# Patient Record
Sex: Male | Born: 1965 | Race: White | Hispanic: No | Marital: Married | Smoking: Never smoker
Health system: Southern US, Community
[De-identification: ages and names within clinical notes are randomized; demographics above are authoritative.]

## PROBLEM LIST (undated history)

## (undated) DIAGNOSIS — B159 Hepatitis A without hepatic coma: Secondary | ICD-10-CM

## (undated) DIAGNOSIS — J151 Pneumonia due to Pseudomonas: Secondary | ICD-10-CM

## (undated) DIAGNOSIS — I482 Chronic atrial fibrillation, unspecified: Secondary | ICD-10-CM

## (undated) DIAGNOSIS — J90 Pleural effusion, not elsewhere classified: Secondary | ICD-10-CM

## (undated) DIAGNOSIS — G7100 Muscular dystrophy, unspecified: Secondary | ICD-10-CM

## (undated) DIAGNOSIS — J9621 Acute and chronic respiratory failure with hypoxia: Secondary | ICD-10-CM

## (undated) HISTORY — PX: COLONOSCOPY W/ POLYPECTOMY: SHX1380

---

## 2008-05-04 HISTORY — PX: PANCREATIC CYST DRAINAGE: SHX2156

## 2008-05-04 HISTORY — PX: CHOLECYSTECTOMY: SHX55

## 2009-12-02 HISTORY — PX: THYROIDECTOMY, PARTIAL: SHX18

## 2016-05-04 HISTORY — PX: CATARACT EXTRACTION, BILATERAL: SHX1313

## 2018-06-24 ENCOUNTER — Inpatient Hospital Stay
Admission: RE | Admit: 2018-06-24 | Discharge: 2018-08-15 | Disposition: A | Discharge status: Rehabilitation Facility | Payer: BLUE CROSS/BLUE SHIELD | Source: Ambulatory Visit | Attending: Internal Medicine | Admitting: Internal Medicine

## 2018-06-24 ENCOUNTER — Other Ambulatory Visit (HOSPITAL_COMMUNITY): Payer: Self-pay

## 2018-06-24 DIAGNOSIS — J9621 Acute and chronic respiratory failure with hypoxia: Secondary | ICD-10-CM | POA: Diagnosis not present

## 2018-06-24 DIAGNOSIS — Z789 Other specified health status: Secondary | ICD-10-CM

## 2018-06-24 DIAGNOSIS — I482 Chronic atrial fibrillation, unspecified: Secondary | ICD-10-CM | POA: Diagnosis not present

## 2018-06-24 DIAGNOSIS — J90 Pleural effusion, not elsewhere classified: Secondary | ICD-10-CM | POA: Diagnosis not present

## 2018-06-24 DIAGNOSIS — G7100 Muscular dystrophy, unspecified: Secondary | ICD-10-CM | POA: Diagnosis not present

## 2018-06-24 DIAGNOSIS — Z9889 Other specified postprocedural states: Secondary | ICD-10-CM

## 2018-06-24 DIAGNOSIS — J151 Pneumonia due to Pseudomonas: Secondary | ICD-10-CM | POA: Diagnosis present

## 2018-06-24 DIAGNOSIS — J9690 Respiratory failure, unspecified, unspecified whether with hypoxia or hypercapnia: Secondary | ICD-10-CM

## 2018-06-24 HISTORY — DX: Pleural effusion, not elsewhere classified: J90

## 2018-06-24 HISTORY — DX: Acute and chronic respiratory failure with hypoxia: J96.21

## 2018-06-24 HISTORY — DX: Chronic atrial fibrillation, unspecified: I48.20

## 2018-06-24 HISTORY — DX: Pneumonia due to Pseudomonas: J15.1

## 2018-06-24 HISTORY — DX: Muscular dystrophy, unspecified: G71.00

## 2018-06-24 LAB — BLOOD GAS, ARTERIAL
Acid-Base Excess: 6.4 mmol/L — ABNORMAL HIGH (ref 0.0–2.0)
Bicarbonate: 30.7 mmol/L — ABNORMAL HIGH (ref 20.0–28.0)
FIO2: 55
O2 Saturation: 96.6 %
PEEP: 5 cmH2O
Patient temperature: 98.6
Pressure control: 12 cmH2O
RATE: 12 resp/min
pCO2 arterial: 47.4 mmHg (ref 32.0–48.0)
pH, Arterial: 7.427 (ref 7.350–7.450)
pO2, Arterial: 87.5 mmHg (ref 83.0–108.0)

## 2018-06-24 MED ORDER — DIPHENHYDRAMINE HCL 50 MG/ML IJ SOLN
25.00 | INTRAMUSCULAR | Status: DC
Start: ? — End: 2018-06-24

## 2018-06-24 MED ORDER — INSULIN GLARGINE 100 UNIT/ML ~~LOC~~ SOLN
1.00 | SUBCUTANEOUS | Status: DC
Start: 2018-06-24 — End: 2018-06-24

## 2018-06-24 MED ORDER — ALBUTEROL SULFATE (2.5 MG/3ML) 0.083% IN NEBU
2.50 | INHALATION_SOLUTION | RESPIRATORY_TRACT | Status: DC
Start: ? — End: 2018-06-24

## 2018-06-24 MED ORDER — FIRST-LANSOPRAZOLE 3 MG/ML PO SUSP
30.00 | ORAL | Status: DC
Start: 2018-06-25 — End: 2018-06-24

## 2018-06-24 MED ORDER — IOPAMIDOL (ISOVUE-300) INJECTION 61%
INTRAVENOUS | Status: AC
  Administered 2018-06-24: 16:00:00
  Filled 2018-06-24: qty 50

## 2018-06-24 MED ORDER — ACETAMINOPHEN 160 MG/5ML PO SUSP
650.00 | ORAL | Status: DC
Start: ? — End: 2018-06-24

## 2018-06-24 MED ORDER — GUAIFENESIN 100 MG/5ML PO LIQD
200.00 | ORAL | Status: DC
Start: 2018-06-24 — End: 2018-06-24

## 2018-06-24 MED ORDER — ACETAMINOPHEN 325 MG PO TABS
650.00 | ORAL_TABLET | ORAL | Status: DC
Start: ? — End: 2018-06-24

## 2018-06-24 MED ORDER — DOCUSATE SODIUM 150 MG/15ML PO LIQD
50.00 | ORAL | Status: DC
Start: 2018-06-25 — End: 2018-06-24

## 2018-06-24 MED ORDER — GENERIC EXTERNAL MEDICATION
5.00 | Status: DC
Start: ? — End: 2018-06-24

## 2018-06-24 MED ORDER — AMIODARONE HCL 200 MG PO TABS
200.00 | ORAL_TABLET | ORAL | Status: DC
Start: 2018-06-25 — End: 2018-06-24

## 2018-06-24 MED ORDER — MELATONIN 1 MG PO TABS
1.00 | ORAL_TABLET | ORAL | Status: DC
Start: 2018-06-24 — End: 2018-06-24

## 2018-06-24 MED ORDER — SODIUM CHLORIDE 0.9 % IV SOLN
10.00 | INTRAVENOUS | Status: DC
Start: ? — End: 2018-06-24

## 2018-06-24 MED ORDER — BARIUM SULFATE 40 % PO SUSP
250.00 | ORAL | Status: DC
Start: ? — End: 2018-06-24

## 2018-06-24 MED ORDER — ACETAMINOPHEN 650 MG RE SUPP
650.00 | RECTAL | Status: DC
Start: ? — End: 2018-06-24

## 2018-06-24 MED ORDER — METOPROLOL TARTRATE 5 MG/5ML IV SOLN
5.00 | INTRAVENOUS | Status: DC
Start: ? — End: 2018-06-24

## 2018-06-24 NOTE — Consult Note (Signed)
Pulmonary Critical Care Medicine [facility] [place]  PULMONARY SERVICE  Date of Service: 06/24/2018  PULMONARY CRITICAL CARE CONSULT   Luis Choi  LFY:[id number]  DOB: 11-17-1965   DOA: 06/24/2018  Referring Physician: Carron Curie, MD  HPI: Luis Choi is a 53 y.o. male seen for follow up of Acute on Chronic Respiratory Failure.  Patient has a history of muscular dystrophy chronic atrial fibrillation hypothyroidism presented to the hospital with increasing palpitations and shortness of breath.  At that time he was found to be in atrial fibrillation with rapid ventricular response treated with IV Cardizem and then subsequently started on a Cardizem drip.  On his chest x-ray on admission he was noted to be in pulmonary edema.  Also had an elevated troponin which was felt to be stress-induced patient had initially been in the hospitalist service however 3 days later ended up being intubated and placed on the ventilator transferred to the ICU.  Apparently patient had some vomiting preceding the intubation which resulted in aspiration pneumonia.  Subsequently patient self extubated however his oxygen requirements went up from 9 to 12 L and ended up having worsening of breathing and so therefore was reintubated.  Spontaneous breathing trials were started on January 13 and patient was subsequently extubated however he only remained off the ventilator for 3-1/2 days.  Cardiac issues continued along with bradycardia as well as tachycardia.  Tracheostomy was eventually performed.  Is sputum grew ESBL Klebsiella and also Pseudomonas so was started on antibiotics.  Pseudomonas was multidrug resistance was treated with meropenem.  Patient was also noted to have a pleural effusion which was evaluated drained and subsequent x-ray showed stabilization.  Review of Systems:  ROS performed and is unremarkable other than noted above.  Past Medical History:  Diagnosis Date  ?Marland Kitchen Atrial fibrillation  (*)  ?. Muscular dystrophy, myotonic (*)  1982  ?Marland Kitchen Viral hepatitis A without mention of hepatic coma  History of Hepatitis, A Virus   Past Surgical History:  Procedure Laterality Date  ?Marland Kitchen Colonoscopy w/ polypectomy N/A 01/11/2015  Procedure: COLONOSCOPY W/ POLYPECTOMY; Surgeon: Chyrl Civatte, MD; Location: [facility] ENDO; Service: Gastroenterology; Laterality: N/A;  ?. Other surgical history  History of Biopsy Muscle - muscular dystophy  ?. Other surgical history  History of Cholecystectomy  ?Marland Kitchen Other surgical history  History of Marsupialization Of Pancreatic Cyst  ?. Other surgical history  History of Thyroid Surgery Hemi-Thyroidectomy Right Lobe  ?Marland Kitchen Other surgical history 12/2009  cyst removed from bile duct  ?Marland Kitchen Thyroidectomy, partial 05/2009   Allergies  Allergen Reactions  ?Marland Kitchen Flonase [Fluticasone]  Nose bleed    Social History   Socioeconomic History  ?. Marital status: Married  Spouse name: Not on file  ?. Number of children: Not on file  ?. Years of education: Not on file  ?Marland Kitchen Highest education level: Not on file  Occupational History  ?Marland Kitchen Not on file  Social Needs  ?Marland Kitchen Financial resource strain: Not on file  ?Marland Kitchen Food insecurity  Worry: Not on file  Inability: Not on file  ?Marland Kitchen Transportation needs  Medical: Not on file  Non-medical: Not on file  Tobacco Use  ?Marland Kitchen Smoking status: Former Smoker  Types: Cigarettes  ?. Smokeless tobacco: Never Used  Substance and Sexual Activity  ?Marland Kitchen Alcohol use: No    Medications: Reviewed on Rounds  Physical Exam:  Vitals: Temperature 98 pulse 70 respiratory 19 blood pressure 95/70 saturations were 99%  Ventilator Settings mode ventilation pressure support FiO2  was 50% tidal volume 500 pressure support 12 PEEP 5  . General: Comfortable at this time . Eyes: Grossly normal lids, irises & conjunctiva . ENT: grossly tongue is normal . Neck: no obvious mass . Cardiovascular: S1-S2 normal no gallop rub is noted  irregular . Respiratory: Coarse rhonchi expansion is equal . Abdomen: Soft and nontender . Skin: no rash seen on limited exam . Musculoskeletal: not rigid . Psychiatric:unable to assess . Neurologic: no seizure no involuntary movements         Labs on Admission:  Basic Metabolic Panel: No results for input(s): NA, K, CL, CO2, GLUCOSE, BUN, CREATININE, CALCIUM, MG, PHOS in the last 168 hours.  No results for input(s): PHART, PCO2ART, PO2ART, HCO3, O2SAT in the last 168 hours.  Liver Function Tests: No results for input(s): AST, ALT, ALKPHOS, BILITOT, PROT, ALBUMIN in the last 168 hours. No results for input(s): LIPASE, AMYLASE in the last 168 hours. No results for input(s): AMMONIA in the last 168 hours.  CBC: No results for input(s): WBC, NEUTROABS, HGB, HCT, MCV, PLT in the last 168 hours.  Cardiac Enzymes: No results for input(s): CKTOTAL, CKMB, CKMBINDEX, TROPONINI in the last 168 hours.  BNP (last 3 results) No results for input(s): BNP in the last 8760 hours.  ProBNP (last 3 results) No results for input(s): PROBNP in the last 8760 hours.   Radiological Exams on Admission: No results found.  Assessment/Plan Active Problems:   Acute on chronic respiratory failure with hypoxia (HCC)   Chronic atrial fibrillation   Pneumonia of both lungs due to Pseudomonas species (HCC)   Pleural effusion, left   Muscular dystrophy (HCC)   1. Acute on chronic respiratory failure with hypoxia we will continue with weaning on protocol which was started.  Patient has failed extubation trials x2 it appears at the other facility so therefore it may be difficult to get him completely off the ventilator at best we may be looking at nocturnal ventilatory support this secondary to his underlying muscular dystrophy. 2. Chronic atrial fibrillation now rate controlled patient did have RVR at the other facility on medications now. 3. Multidrug-resistant Pseudomonas pneumonia treated with  antibiotics we will follow-up on radiological studies.  Continue with supportive care 4. Left-sided pleural effusion reportedly was stable we will follow-up radiologically continue with supportive care 5. Muscular dystrophy advanced disease has failed extubation numerous times we will continue to assess may need nocturnal ventilatory support as indicated  I have personally seen and evaluated the patient, evaluated laboratory and imaging results, formulated the assessment and plan and placed orders. The Patient requires high complexity decision making for assessment and support.  Case was discussed on Rounds with the Respiratory Therapy Staff Time Spent  Yevonne Pax, MD [facility] Pulmonary Critical Care Medicine Sleep Medicine

## 2018-06-25 LAB — CBC
HCT: 33.8 % — ABNORMAL LOW (ref 39.0–52.0)
Hemoglobin: 10.1 g/dL — ABNORMAL LOW (ref 13.0–17.0)
MCH: 29.3 pg (ref 26.0–34.0)
MCHC: 29.9 g/dL — AB (ref 30.0–36.0)
MCV: 98 fL (ref 80.0–100.0)
Platelets: 432 10*3/uL — ABNORMAL HIGH (ref 150–400)
RBC: 3.45 MIL/uL — ABNORMAL LOW (ref 4.22–5.81)
RDW: 14.1 % (ref 11.5–15.5)
WBC: 8.2 10*3/uL (ref 4.0–10.5)
nRBC: 0 % (ref 0.0–0.2)

## 2018-06-25 LAB — COMPREHENSIVE METABOLIC PANEL
ALT: 51 U/L — ABNORMAL HIGH (ref 0–44)
AST: 46 U/L — AB (ref 15–41)
Albumin: 2.5 g/dL — ABNORMAL LOW (ref 3.5–5.0)
Alkaline Phosphatase: 311 U/L — ABNORMAL HIGH (ref 38–126)
Anion gap: 9 (ref 5–15)
BUN: 16 mg/dL (ref 6–20)
CALCIUM: 8.9 mg/dL (ref 8.9–10.3)
CO2: 31 mmol/L (ref 22–32)
Chloride: 100 mmol/L (ref 98–111)
Creatinine, Ser: 0.58 mg/dL — ABNORMAL LOW (ref 0.61–1.24)
GFR calc Af Amer: >60 mL/min (ref >60)
GFR calc non Af Amer: >60 mL/min (ref >60)
Glucose, Bld: 104 mg/dL — ABNORMAL HIGH (ref 70–99)
Potassium: 5 mmol/L (ref 3.5–5.1)
Sodium: 140 mmol/L (ref 135–145)
Total Bilirubin: 0.7 mg/dL (ref 0.3–1.2)
Total Protein: 6.8 g/dL (ref 6.5–8.1)

## 2018-06-27 ENCOUNTER — Encounter: Payer: Self-pay | Admitting: Internal Medicine

## 2018-06-27 ENCOUNTER — Other Ambulatory Visit (HOSPITAL_COMMUNITY): Payer: Self-pay

## 2018-06-27 DIAGNOSIS — G7100 Muscular dystrophy, unspecified: Secondary | ICD-10-CM | POA: Diagnosis present

## 2018-06-27 DIAGNOSIS — J90 Pleural effusion, not elsewhere classified: Secondary | ICD-10-CM | POA: Diagnosis not present

## 2018-06-27 DIAGNOSIS — J151 Pneumonia due to Pseudomonas: Secondary | ICD-10-CM | POA: Diagnosis present

## 2018-06-27 DIAGNOSIS — I482 Chronic atrial fibrillation, unspecified: Secondary | ICD-10-CM | POA: Diagnosis present

## 2018-06-27 DIAGNOSIS — J9621 Acute and chronic respiratory failure with hypoxia: Secondary | ICD-10-CM | POA: Diagnosis not present

## 2018-06-27 LAB — CULTURE, RESPIRATORY W GRAM STAIN

## 2018-06-27 LAB — CULTURE, RESPIRATORY

## 2018-06-27 MED ORDER — IOHEXOL 300 MG/ML  SOLN
75.0000 mL | Freq: Once | INTRAMUSCULAR | Status: AC | PRN
  Administered 2018-06-27: 75 mL via INTRAVENOUS

## 2018-06-27 NOTE — Progress Notes (Signed)
Pulmonary Critical Care Medicine [facility] [place]   PULMONARY CRITICAL CARE SERVICE  PROGRESS NOTE  Date of Service: 06/27/2018  Luis Choi  MRN:[id number]  DOB: 04/28/1966   DOA: 06/24/2018  Referring Physician: Ali Hijazi, MD  HPI: Luis Choi is a 52 y.o. male seen for follow up of Acute on Chronic Respiratory Failure.  Patient is on pressure support mode at this time requiring about 50% FiO2 has been weaning on pressure support not advanced yet otherwise  Medications: Reviewed on Rounds  Physical Exam:  Vitals: Temperature 97.0 pulse 65 respiratory 14 blood pressure 99/64 saturations 99%  Ventilator Settings mode ventilation pressure support FiO2 50% tidal volume 511 PEEP 5 pressure support 12  . General: Comfortable at this time . Eyes: Grossly normal lids, irises & conjunctiva . ENT: grossly tongue is normal . Neck: no obvious mass . Cardiovascular: S1 S2 normal no gallop . Respiratory: Coarse rhonchi expansion is equal . Abdomen: soft . Skin: no rash seen on limited exam . Musculoskeletal: not rigid . Psychiatric:unable to assess . Neurologic: no seizure no involuntary movements         Lab Data:   Basic Metabolic Panel: Recent Labs  Lab 06/25/18 0516  NA 140  K 5.0  CL 100  CO2 31  GLUCOSE 104*  BUN 16  CREATININE 0.58*  CALCIUM 8.9    ABG: Recent Labs  Lab 06/24/18 1400  PHART 7.427  PCO2ART 47.4  PO2ART 87.5  HCO3 30.7*  O2SAT 96.6    Liver Function Tests: Recent Labs  Lab 06/25/18 0516  AST 46*  ALT 51*  ALKPHOS 311*  BILITOT 0.7  PROT 6.8  ALBUMIN 2.5*   No results for input(s): LIPASE, AMYLASE in the last 168 hours. No results for input(s): AMMONIA in the last 168 hours.  CBC: Recent Labs  Lab 06/25/18 0516  WBC 8.2  HGB 10.1*  HCT 33.8*  MCV 98.0  PLT 432*    Cardiac Enzymes: No results for input(s): CKTOTAL, CKMB, CKMBINDEX, TROPONINI in the last 168 hours.  BNP (last 3 results) No  results for input(s): BNP in the last 8760 hours.  ProBNP (last 3 results) No results for input(s): PROBNP in the last 8760 hours.  Radiological Exams: Ct Chest W Contrast  Result Date: 06/27/2018 CLINICAL DATA:  Chest abscess. EXAM: CT CHEST WITH CONTRAST TECHNIQUE: Multidetector CT imaging of the chest was performed during intravenous contrast administration. CONTRAST:  32mLIsidoro DonniHou1.9KoreMarland Kitchena1M3295Ludwig 84mlIsidoro DonniNell1.9KoreMarland [id number] 60mlIsidoro DonniSpenc1.9KoreMarland [id number] 39mlIsidoro DonniLittle A1.9KoreMarland [id number] 61mlIsidoro Donn1.9KoreMarland [id number] 81mlIsidoro DonniWhe1.9KoreMarland [id number] 6mlIsidoro DonniS1.9KoreMarland [id number] 24mlIsidoro Donni1.9KoreMarland Kitchena1M27295Ludwig 63mlIsidoro DonniCou1.9KoreMarland [id number] 27mlIsidoro DonniPalmon1.9KoreMarland [id number] 63mlIsidoro DonniMa1.9KoreMarland [id number] 15mlIsidoro DonniWounde1.9KoreMarland Kitchena1M4295Ludwig 74mlIsidoro DonniWest Gl1.9KoreMarland [id number] 36mlIsidoro DonniV1.9KoreMarland [id number] 30mlIsidoro DonniPri1.9KoreMarland [id number] 52mlIsidoro DonniSeasid1.9KoreMarland Kitchena1M5295Ludwig 59mlIsidoro Don1.9KoreMarland Kitchena1M19295Ludwig 28mlIsidoro DonniHayde1.9KoreMarland Kitchena1M78295Ludwig 60mlIsidoro Donni1.9KoreMarland [id number] 31mlIsidoro DonniHain1.9KoreMarland Kitchena1M7295Ludwig 3mlIsidoro Donni1.9KoreMarland Kitchena1M8295Ludwig 52mlIsidoro Donn1.9KoreMarland Kitchena1M5295Ludwig 39mlIsidoro DonniTunn1.9KoreMarland [id number] 87mlIsidoro DonniParamount-Long 1.9KoreMarland [id number] 27mlIsidoro DonniWo1.9KoreMarland Kitchena1M30295Ludwig 50mlIsidoro DonniLak1.9KoreMarland [id number] 37mlIsidoro DonniM1.9KoreMarland [id number] Clarksry SneddonHEXOL 300 MG/ML  SOLN COMPARISON:  Radiography 06/24/2018 FINDINGS: Cardiovascular: Heart size upper limits of normal. No pericardial fluid. No visible coronary artery calcification. Aorta is normal size without evidence of atherosclerotic calcification. Pulmonary artery opacification is only low, but no large pulmonary emboli are seen. Mediastinum/Nodes: Tracheostomy in place without complicating feature. Evidence mediastinal mass, adenopathy or fluid collection. Lungs/Pleura: Moderate to large bilateral effusions layering dependently with dependent pulmonary atelectasis. Subtotal collapse of both lower lobes. I do not see evidence of an abscess in the collapsed lung. No evidence of loculation of the pleural fluid. In the aerated portions of the lung, no abnormality is seen on the left. On the right,there is a 3 x 1.3 x 1.5 cm mass in the middle lobe which could represent a carcinoma. Small adjacent satellite nodule measuring about 3-4 mm. In the right upper lobe, there is a 1 x 1.2 cm indistinct density which could be inflammatory or neoplastic. Upper Abdomen: Negative Musculoskeletal: Normal IMPRESSION: Moderate to large bilateral pleural effusions layering dependently with dependent  pulmonary atelectasis. Subtotal collapse of both lower lobes. Evidence of loculation or definable abscess. 3 x 1.3 x 1.5 cm mass in the right middle lobe worrisome for lung carcinoma. Second 12 mm density in the right upper lobe which could be inflammatory or neoplastic. Electronically Signed   By: Paulina Fusi M.D.   On: 06/27/2018 06:43     Assessment/Plan Active Problems:   Acute on chronic respiratory failure with hypoxia (HCC)   Chronic atrial fibrillation   Pneumonia of both lungs due to Pseudomonas species (HCC)   Pleural effusion, left   Muscular dystrophy (HCC)   1. Acute on chronic respiratory failure with hypoxia patient is on pressure support mode spoke with team on rounds and will try to see if he is able to do any weaning. 2. Chronic atrial fibrillation rate controlled at this time 3. Pneumonia due to Pseudomonas patient has multidrug resistance patient was treated we will continue to follow radiologically.  Last chest x-ray showing a large bilateral effusion with some atelectasis 4. Pleural effusion noted to be significantly larger on the CT scan also with mass was noted or loculation.  Will discuss with primary care team. 5. Muscular dystrophy advanced disease we will continue with supportive care   I have personally seen and evaluated the patient, evaluated laboratory and imaging results, formulated the assessment and plan and placed orders. The Patient requires high complexity decision making for assessment and support.  Case was discussed on Rounds with the Respiratory Therapy Staff  Yevonne Pax, MD [facility] Pulmonary Critical Care Medicine Sleep Medicine

## 2018-06-28 ENCOUNTER — Other Ambulatory Visit (HOSPITAL_COMMUNITY): Payer: Self-pay

## 2018-06-28 DIAGNOSIS — G7100 Muscular dystrophy, unspecified: Secondary | ICD-10-CM | POA: Diagnosis not present

## 2018-06-28 DIAGNOSIS — I482 Chronic atrial fibrillation, unspecified: Secondary | ICD-10-CM | POA: Diagnosis not present

## 2018-06-28 DIAGNOSIS — J9621 Acute and chronic respiratory failure with hypoxia: Secondary | ICD-10-CM | POA: Diagnosis not present

## 2018-06-28 DIAGNOSIS — J90 Pleural effusion, not elsewhere classified: Secondary | ICD-10-CM | POA: Diagnosis not present

## 2018-06-28 LAB — GRAM STAIN

## 2018-06-28 LAB — BASIC METABOLIC PANEL
Anion gap: 10 (ref 5–15)
BUN: 17 mg/dL (ref 6–20)
CO2: 24 mmol/L (ref 22–32)
Calcium: 9.1 mg/dL (ref 8.9–10.3)
Chloride: 103 mmol/L (ref 98–111)
Creatinine, Ser: 0.59 mg/dL — ABNORMAL LOW (ref 0.61–1.24)
GFR calc Af Amer: >60 mL/min (ref >60)
GFR calc non Af Amer: >60 mL/min (ref >60)
GLUCOSE: 134 mg/dL — AB (ref 70–99)
Potassium: 4.6 mmol/L (ref 3.5–5.1)
Sodium: 137 mmol/L (ref 135–145)

## 2018-06-28 LAB — BODY FLUID CELL COUNT WITH DIFFERENTIAL
Eos, Fluid: 9 %
Lymphs, Fluid: 25 %
Monocyte-Macrophage-Serous Fluid: 34 % — ABNORMAL LOW (ref 50–90)
NEUTROPHIL FLUID: 32 % — AB (ref 0–25)
Total Nucleated Cell Count, Fluid: 1050 cu mm — ABNORMAL HIGH (ref 0–1000)

## 2018-06-28 LAB — LACTATE DEHYDROGENASE, PLEURAL OR PERITONEAL FLUID: LD, Fluid: 139 U/L — ABNORMAL HIGH (ref 3–23)

## 2018-06-28 LAB — GENTAMICIN LEVEL, RANDOM: Gentamicin Rm: 13.8 ug/mL

## 2018-06-28 MED ORDER — LIDOCAINE HCL (PF) 1 % IJ SOLN
INTRAMUSCULAR | Status: AC
  Filled 2018-06-28: qty 30

## 2018-06-28 NOTE — Progress Notes (Signed)
Pulmonary Critical Care Medicine [facility] [place]   PULMONARY CRITICAL CARE SERVICE  PROGRESS NOTE  Date of Service: 06/28/2018  Luis Choi  JXB:[id number]  DOB: 01/20/1966   DOA: 06/24/2018  Referring Physician: Carron Curie, MD  HPI: Luis Choi is a 53 y.o. male seen for follow up of Acute on Chronic Respiratory Failure.  Patient had a thoracentesis done removed about 1 L of fluid on the left side  Medications: Reviewed on Rounds  Physical Exam:  Vitals: Temperature 97.5 pulse 70 respiratory 15 blood pressure 91/59 saturations 97%  Ventilator Settings mode ventilation pressure support FiO2 is 40% tidal volume 700 per support 12 PEEP 5  . General: Comfortable at this time . Eyes: Grossly normal lids, irises & conjunctiva . ENT: grossly tongue is normal . Neck: no obvious mass . Cardiovascular: S1 S2 normal no gallop . Respiratory: Scattered rhonchi expansion is equal at this time . Abdomen: soft . Skin: no rash seen on limited exam . Musculoskeletal: not rigid . Psychiatric:unable to assess . Neurologic: no seizure no involuntary movements         Lab Data:   Basic Metabolic Panel: Recent Labs  Lab 06/25/18 0516 06/28/18 1033  NA 140 137  K 5.0 4.6  CL 100 103  CO2 31 24  GLUCOSE 104* 134*  BUN 16 17  CREATININE 0.58* 0.59*  CALCIUM 8.9 9.1    ABG: Recent Labs  Lab 06/24/18 1400  PHART 7.427  PCO2ART 47.4  PO2ART 87.5  HCO3 30.7*  O2SAT 96.6    Liver Function Tests: Recent Labs  Lab 06/25/18 0516  AST 46*  ALT 51*  ALKPHOS 311*  BILITOT 0.7  PROT 6.8  ALBUMIN 2.5*   No results for input(s): LIPASE, AMYLASE in the last 168 hours. No results for input(s): AMMONIA in the last 168 hours.  CBC: Recent Labs  Lab 06/25/18 0516  WBC 8.2  HGB 10.1*  HCT 33.8*  MCV 98.0  PLT 432*    Cardiac Enzymes: No results for input(s): CKTOTAL, CKMB, CKMBINDEX, TROPONINI in the last 168 hours.  BNP (last 3 results) No  results for input(s): BNP in the last 8760 hours.  ProBNP (last 3 results) No results for input(s): PROBNP in the last 8760 hours.  Radiological Exams: Ct Chest W Contrast  Result Date: 06/27/2018 CLINICAL DATA:  Chest abscess. EXAM: CT CHEST WITH CONTRAST TECHNIQUE: Multidetector CT imaging of the chest was performed during intravenous contrast administration. CONTRAST:  70mL OMNIPAQUE IOHEXOL 300 MG/ML  SOLN COMPARISON:  Radiography 06/24/2018 FINDINGS: Cardiovascular: Heart size upper limits of normal. No pericardial fluid. No visible coronary artery calcification. Aorta is normal size without evidence of atherosclerotic calcification. Pulmonary artery opacification is only low, but no large pulmonary emboli are seen. Mediastinum/Nodes: Tracheostomy in place without complicating feature. Evidence mediastinal mass, adenopathy or fluid collection. Lungs/Pleura: Moderate to large bilateral effusions layering dependently with dependent pulmonary atelectasis. Subtotal collapse of both lower lobes. I do not see evidence of an abscess in the collapsed lung. No evidence of loculation of the pleural fluid. In the aerated portions of the lung, no abnormality is seen on the left. On the right,there is a 3 x 1.3 x 1.5 cm mass in the middle lobe which could represent a carcinoma. Small adjacent satellite nodule measuring about 3-4 mm. In the right upper lobe, there is a 1 x 1.2 cm indistinct density which could be inflammatory or neoplastic. Upper Abdomen: Negative Musculoskeletal: Normal IMPRESSION: Moderate to large bilateral  pleural effusions layering dependently with dependent pulmonary atelectasis. Subtotal collapse of both lower lobes. Evidence of loculation or definable abscess. 3 x 1.3 x 1.5 cm mass in the right middle lobe worrisome for lung carcinoma. Second 12 mm density in the right upper lobe which could be inflammatory or neoplastic. Electronically Signed   By: Paulina Fusi M.D.   On: 06/27/2018 06:43    Dg Chest Port 1 View  Result Date: 06/28/2018 CLINICAL DATA:  Post thoracentesis. EXAM: PORTABLE CHEST 1 VIEW COMPARISON:  Ultrasound 06/28/2018. CT chest 06/27/2018. Chest x-ray 06/24/2018. FINDINGS: Tracheostomy tube noted stable position. Stable cardiomegaly. Stable bibasilar atelectasis/infiltrates and right middle lobe density. Small bilateral pleural effusions. No pneumothorax post thoracentesis. No acute bony abnormality. IMPRESSION: Stable chest post thoracentesis.  No evidence of pneumothorax. Electronically Signed   By: Maisie Fus  Register   On: 06/28/2018 13:40    Assessment/Plan Active Problems:   Acute on chronic respiratory failure with hypoxia (HCC)   Chronic atrial fibrillation   Pneumonia of both lungs due to Pseudomonas species (HCC)   Pleural effusion, left   Muscular dystrophy (HCC)   1. Acute on chronic respiratory failure with hypoxia patient has been weaning on pressure support reportedly at night he is having periods of apnea which may complicate matters as far as being able to stay off the ventilator will need to continue to observe. 2. Chronic atrial fibrillation rate controlled at this time 3. Pleural effusion status post thoracentesis 4. 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 Lobar pneumonia due to Pseudomonas treated   I have personally seen and evaluated the patient, evaluated laboratory and imaging results, formulated the assessment and plan and placed orders. The Patient requires high complexity decision making for assessment and support.  Case was discussed on Rounds with the Respiratory Therapy Staff  Yevonne Pax, MD [facility] Pulmonary Critical Care Medicine Sleep Medicine

## 2018-06-28 NOTE — Procedures (Signed)
PROCEDURE SUMMARY:  Successful US guided left thoracentesis. Yielded 1.0 liters of amber fluid. Pt tolerated procedure well. No immediate complications.  Specimen was sent for labs. CXR ordered.  EBL < 5 mL  Hoyt Koch PA-C 06/28/2018 11:59 AM

## 2018-06-29 DIAGNOSIS — I482 Chronic atrial fibrillation, unspecified: Secondary | ICD-10-CM | POA: Diagnosis not present

## 2018-06-29 DIAGNOSIS — J9621 Acute and chronic respiratory failure with hypoxia: Secondary | ICD-10-CM | POA: Diagnosis not present

## 2018-06-29 DIAGNOSIS — G7100 Muscular dystrophy, unspecified: Secondary | ICD-10-CM | POA: Diagnosis not present

## 2018-06-29 DIAGNOSIS — J90 Pleural effusion, not elsewhere classified: Secondary | ICD-10-CM | POA: Diagnosis not present

## 2018-06-29 LAB — PATHOLOGIST SMEAR REVIEW

## 2018-06-29 LAB — GENTAMICIN LEVEL, PEAK: GENTAMICIN PK: 10.4 ug/mL — AB (ref 5.0–10.0)

## 2018-06-29 NOTE — Progress Notes (Addendum)
Pulmonary Critical Care Medicine [facility] [place]   PULMONARY CRITICAL CARE SERVICE  PROGRESS NOTE  Date of Service: 06/29/2018  Luis Choi  [id number]:[id number]  DOB: 1966/04/16   DOA: 06/24/2018  Referring Physician: Carron Curie, MD  HPI: Luis Choi is a 53 y.o. male seen for follow up of Acute on Chronic Respiratory Failure.  Patient is doing well have a goal of 12 hours on pressure support today.  Patient is currently working on that and doing well at this time.  Medications: Reviewed on Rounds  Physical Exam:  Vitals: Pulse 69 respiration 17 BP 91/50 O2 sat 95% temp 97.0  Ventilator Settings patient's current ventilator mode pressure support 12/5 FiO2 28%.  . General: Comfortable at this time . Eyes: Grossly normal lids, irises & conjunctiva . ENT: grossly tongue is normal . Neck: no obvious mass . Cardiovascular: S1 S2 normal no gallop . Respiratory: Coarse breath sounds . Abdomen: soft . Skin: no rash seen on limited exam . Musculoskeletal: not rigid . Psychiatric:unable to assess . Neurologic: no seizure no involuntary movements         Lab Data:   Basic Metabolic Panel: Recent Labs  Lab 06/25/18 0516 06/28/18 1033  NA 140 137  K 5.0 4.6  CL 100 103  CO2 31 24  GLUCOSE 104* 134*  BUN 16 17  CREATININE 0.58* 0.59*  CALCIUM 8.9 9.1    ABG: Recent Labs  Lab 06/24/18 1400  PHART 7.427  PCO2ART 47.4  PO2ART 87.5  HCO3 30.7*  O2SAT 96.6    Liver Function Tests: Recent Labs  Lab 06/25/18 0516  AST 46*  ALT 51*  ALKPHOS 311*  BILITOT 0.7  PROT 6.8  ALBUMIN 2.5*   No results for input(s): LIPASE, AMYLASE in the last 168 hours. No results for input(s): AMMONIA in the last 168 hours.  CBC: Recent Labs  Lab 06/25/18 0516  WBC 8.2  HGB 10.1*  HCT 33.8*  MCV 98.0  PLT 432*    Cardiac Enzymes: No results for input(s): CKTOTAL, CKMB, CKMBINDEX, TROPONINI in the last 168 hours.  BNP (last 3 results) No results for  input(s): BNP in the last 8760 hours.  ProBNP (last 3 results) No results for input(s): PROBNP in the last 8760 hours.  Radiological Exams: Dg Chest Port 1 View  Result Date: 06/28/2018 CLINICAL DATA:  Post thoracentesis. EXAM: PORTABLE CHEST 1 VIEW COMPARISON:  Ultrasound 06/28/2018. CT chest 06/27/2018. Chest x-ray 06/24/2018. FINDINGS: Tracheostomy tube noted stable position. Stable cardiomegaly. Stable bibasilar atelectasis/infiltrates and right middle lobe density. Small bilateral pleural effusions. No pneumothorax post thoracentesis. No acute bony abnormality. IMPRESSION: Stable chest post thoracentesis.  No evidence of pneumothorax. Electronically Signed   By: Maisie Fus  Register   On: 06/28/2018 13:40   US Thoracentesis Asp Pleural Space W/img Guide  Result Date: 06/28/2018 INDICATION: Patient with history of respiratory failure, now with bilateral pleural effusions. Request is made for diagnostic and therapeutic thoracentesis. EXAM: ULTRASOUND GUIDED DIAGNOSTIC AND THERAPEUTIC LEFT THORACENTESIS MEDICATIONS: 10 mL 1% lidocaine COMPLICATIONS: None immediate. PROCEDURE: An ultrasound guided thoracentesis was thoroughly discussed with the patient and questions answered. The benefits, risks, alternatives and complications were also discussed. The patient understands and wishes to proceed with the procedure. Written consent was obtained. Ultrasound was performed to localize and mark an adequate pocket of fluid in the left chest. The area was then prepped and draped in the normal sterile fashion. 1% Lidocaine was used for local anesthesia. Under ultrasound guidance a 6  Fr Safe-T-Centesis catheter was introduced. Thoracentesis was performed. The catheter was removed and a dressing applied. FINDINGS: A total of approximately 1.0 liters of amber fluid was removed. Samples were sent to the laboratory as requested by the clinical team. IMPRESSION: Successful ultrasound guided diagnostic and therapeutic left  thoracentesis yielding 1.0 liters of pleural fluid. Read by: Loyce Dys PA-C Electronically Signed   By: Irish Lack M.D.   On: 06/28/2018 13:01    Assessment/Plan Active Problems:   Acute on chronic respiratory failure with hypoxia (HCC)   Chronic atrial fibrillation   Pneumonia of both lungs due to Pseudomonas species (HCC)   Pleural effusion, left   Muscular dystrophy (HCC)   1. Acute on chronic respiratory failure with hypoxia patient has been weaning on pressure support and doing well at this time.  There is reports of patient having periods of apnea at night, we will continue to observe this as it may make it difficult to completely wean ventilator. 2. Chronic atrial fibrillation rate control at this time 3. Pleural effusion status post thoracentesis 4. 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 Lobar pneumonia due to Pseudomonas treated   I have personally seen and evaluated the patient, evaluated laboratory and imaging results, formulated the assessment and plan and placed orders. The Patient requires high complexity decision making for assessment and support.  Case was discussed on Rounds with the Respiratory Therapy Staff  Yevonne Pax, MD [facility] Pulmonary Critical Care Medicine Sleep Medicine

## 2018-06-30 DIAGNOSIS — J9621 Acute and chronic respiratory failure with hypoxia: Secondary | ICD-10-CM | POA: Diagnosis not present

## 2018-06-30 DIAGNOSIS — J90 Pleural effusion, not elsewhere classified: Secondary | ICD-10-CM | POA: Diagnosis not present

## 2018-06-30 DIAGNOSIS — G7100 Muscular dystrophy, unspecified: Secondary | ICD-10-CM | POA: Diagnosis not present

## 2018-06-30 DIAGNOSIS — I482 Chronic atrial fibrillation, unspecified: Secondary | ICD-10-CM | POA: Diagnosis not present

## 2018-06-30 NOTE — Progress Notes (Addendum)
Pulmonary Critical Care Medicine [facility] [place]   PULMONARY CRITICAL CARE SERVICE  PROGRESS NOTE  Date of Service: 06/30/2018  Luis Choi  ZJQ:[id number]  DOB: [birth year]   DOA: 06/24/2018  Referring Physician: Carron Curie, MD  HPI: Luis Choi is a 53 y.o. male seen for follow up of Acute on Chronic Respiratory Failure.  Patient did well yesterday with a goal of 12 hours on pressure support.  The goal today is 16 hours currently on 28% FiO2.  Medications: Reviewed on Rounds  Physical Exam:  Vitals: Pulse 66 respirations 14 BP 94/58 O2 sat 94% temp 96.0  Ventilator Settings patient is currently on pressure support 12/5 28% FiO2  . General: Comfortable at this time . Eyes: Grossly normal lids, irises & conjunctiva . ENT: grossly tongue is normal . Neck: no obvious mass . Cardiovascular: S1 S2 normal no gallop . Respiratory: Coarse breath sounds . Abdomen: soft . Skin: no rash seen on limited exam . Musculoskeletal: not rigid . Psychiatric:unable to assess . Neurologic: no seizure no involuntary movements         Lab Data:   Basic Metabolic Panel: Recent Labs  Lab 06/25/18 0516 06/28/18 1033  NA 140 137  K 5.0 4.6  CL 100 103  CO2 31 24  GLUCOSE 104* 134*  BUN 16 17  CREATININE 0.58* 0.59*  CALCIUM 8.9 9.1    ABG: Recent Labs  Lab 06/24/18 1400  PHART 7.427  PCO2ART 47.4  PO2ART 87.5  HCO3 30.7*  O2SAT 96.6    Liver Function Tests: Recent Labs  Lab 06/25/18 0516  AST 46*  ALT 51*  ALKPHOS 311*  BILITOT 0.7  PROT 6.8  ALBUMIN 2.5*   No results for input(s): LIPASE, AMYLASE in the last 168 hours. No results for input(s): AMMONIA in the last 168 hours.  CBC: Recent Labs  Lab 06/25/18 0516  WBC 8.2  HGB 10.1*  HCT 33.8*  MCV 98.0  PLT 432*    Cardiac Enzymes: No results for input(s): CKTOTAL, CKMB, CKMBINDEX, TROPONINI in the last 168 hours.  BNP (last 3 results) No results for input(s): BNP in the last  8760 hours.  ProBNP (last 3 results) No results for input(s): PROBNP in the last 8760 hours.  Radiological Exams: No results found.  Assessment/Plan Active Problems:   Acute on chronic respiratory failure with hypoxia (HCC)   Chronic atrial fibrillation   Pneumonia of both lungs due to Pseudomonas species (HCC)   Pleural effusion, left   Muscular dystrophy (HCC)   1. Acute on chronic respiratory failure with hypoxia patient has been weaning on pressure support will continue at this time.  There is still comes some concern about some periods of apnea the patient is having at night.  We will continue to observe this during weaning. 2. Chronic atrial fibrillation rate controlled at this time 3. Pleural effusion status post thoracentesis 4. 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 Lobar pneumonia due to Pseudomonas treated   I have personally seen and evaluated the patient, evaluated laboratory and imaging results, formulated the assessment and plan and placed orders. The Patient requires high complexity decision making for assessment and support.  Case was discussed on Rounds with the Respiratory Therapy Staff  Yevonne Pax, MD [facility] Pulmonary Critical Care Medicine Sleep Medicine

## 2018-07-01 DIAGNOSIS — J9621 Acute and chronic respiratory failure with hypoxia: Secondary | ICD-10-CM | POA: Diagnosis not present

## 2018-07-01 DIAGNOSIS — G7100 Muscular dystrophy, unspecified: Secondary | ICD-10-CM | POA: Diagnosis not present

## 2018-07-01 DIAGNOSIS — I482 Chronic atrial fibrillation, unspecified: Secondary | ICD-10-CM | POA: Diagnosis not present

## 2018-07-01 DIAGNOSIS — J90 Pleural effusion, not elsewhere classified: Secondary | ICD-10-CM | POA: Diagnosis not present

## 2018-07-01 LAB — GENTAMICIN LEVEL, TROUGH
Gentamicin Trough: 7.6 ug/mL (ref 0.5–2.0)
Gentamicin Trough: 5 ug/mL (ref 0.5–2.0)

## 2018-07-01 NOTE — Progress Notes (Signed)
Pulmonary Critical Care Medicine [facility] [place]   PULMONARY CRITICAL CARE SERVICE  PROGRESS NOTE  Date of Service: 07/01/2018  Luis Choi  ZES:[id number]  DOB: [birth year]   DOA: 06/24/2018  Referring Physician: Carron Curie, MD  HPI: Luis Choi is a 53 y.o. male seen for follow up of Acute on Chronic Respiratory Failure.  Currently patient is on pressure support the goal is for 12 hours tolerating it well  Medications: Reviewed on Rounds  Physical Exam:  Vitals: Temperature 97.0 pulse 74 respiratory 24 blood pressure 98/58 saturations 94%  Ventilator Settings mode ventilation pressure support FiO2 28% pressure 12 PEEP 5  . General: Comfortable at this time . Eyes: Grossly normal lids, irises & conjunctiva . ENT: grossly tongue is normal . Neck: no obvious mass . Cardiovascular: S1 S2 normal no gallop . Respiratory: No rhonchi or rales are noted at this time . Abdomen: soft . Skin: no rash seen on limited exam . Musculoskeletal: not rigid . Psychiatric:unable to assess . Neurologic: no seizure no involuntary movements         Lab Data:   Basic Metabolic Panel: Recent Labs  Lab 06/25/18 0516 06/28/18 1033  NA 140 137  K 5.0 4.6  CL 100 103  CO2 31 24  GLUCOSE 104* 134*  BUN 16 17  CREATININE 0.58* 0.59*  CALCIUM 8.9 9.1    ABG: Recent Labs  Lab 06/24/18 1400  PHART 7.427  PCO2ART 47.4  PO2ART 87.5  HCO3 30.7*  O2SAT 96.6    Liver Function Tests: Recent Labs  Lab 06/25/18 0516  AST 46*  ALT 51*  ALKPHOS 311*  BILITOT 0.7  PROT 6.8  ALBUMIN 2.5*   No results for input(s): LIPASE, AMYLASE in the last 168 hours. No results for input(s): AMMONIA in the last 168 hours.  CBC: Recent Labs  Lab 06/25/18 0516  WBC 8.2  HGB 10.1*  HCT 33.8*  MCV 98.0  PLT 432*    Cardiac Enzymes: No results for input(s): CKTOTAL, CKMB, CKMBINDEX, TROPONINI in the last 168 hours.  BNP (last 3 results) No results for input(s): BNP  in the last 8760 hours.  ProBNP (last 3 results) No results for input(s): PROBNP in the last 8760 hours.  Radiological Exams: No results found.  Assessment/Plan Active Problems:   Acute on chronic respiratory failure with hypoxia (HCC)   Chronic atrial fibrillation   Pneumonia of both lungs due to Pseudomonas species (HCC)   Pleural effusion, left   Muscular dystrophy (HCC)   1. Acute on chronic respiratory failure with hypoxia we will continue with pressure support mode for a goal of 12 hours patient was not able to complete yesterday's goal 2. Chronic atrial fibrillation rate controlled 3. Pneumonia treated clinically improving 4. Pleural effusion will follow radiologically 5. Muscular dystrophy advanced disease continue with supportive care   I have personally seen and evaluated the patient, evaluated laboratory and imaging results, formulated the assessment and plan and placed orders. The Patient requires high complexity decision making for assessment and support.  Case was discussed on Rounds with the Respiratory Therapy Staff  Yevonne Pax, MD [facility] Pulmonary Critical Care Medicine Sleep Medicine

## 2018-07-02 DIAGNOSIS — J90 Pleural effusion, not elsewhere classified: Secondary | ICD-10-CM | POA: Diagnosis not present

## 2018-07-02 DIAGNOSIS — G7100 Muscular dystrophy, unspecified: Secondary | ICD-10-CM | POA: Diagnosis not present

## 2018-07-02 DIAGNOSIS — J9621 Acute and chronic respiratory failure with hypoxia: Secondary | ICD-10-CM | POA: Diagnosis not present

## 2018-07-02 DIAGNOSIS — I482 Chronic atrial fibrillation, unspecified: Secondary | ICD-10-CM | POA: Diagnosis not present

## 2018-07-02 LAB — GENTAMICIN LEVEL, TROUGH: Gentamicin Trough: <0.5 ug/mL — ABNORMAL LOW (ref 0.5–2.0)

## 2018-07-02 NOTE — Progress Notes (Addendum)
Pulmonary Critical Care Medicine [facility] [place]   PULMONARY CRITICAL CARE SERVICE  PROGRESS NOTE  Date of Service: 07/02/2018  [name]  [id number]  DOB: [birth year]   DOA: 06/24/2018  Referring Physician: Carron Curie, MD  HPI: Luis Choi is a 53 y.o. male seen for follow up of Acute on Chronic Respiratory Failure.  Patient is able to tolerate 12 to 13 hours on pressure support 12/5 FiO2 28% with out difficulty.  He cannot make it more than 13 hours before he begins to tire out.  Continues to rest on the ventilator AC PC rate of 12 with a 28% FiO2 when not on pressure support.  Medications: Reviewed on Rounds  Physical Exam:  Vitals: Pulse 63 respirations 20 BP 94/62 O2 sat 94% temp 98.7  Ventilator Settings patient is currently on pressure support 12/5 FiO2 28%.  He will rest on the ventilator with a settings of AC PC rate of 12 IP 12 PEEP of 5 FiO2 28%  . General: Comfortable at this time . Eyes: Grossly normal lids, irises & conjunctiva . ENT: grossly tongue is normal . Neck: no obvious mass . Cardiovascular: S1 S2 normal no gallop . Respiratory: No rales or rhonchi noted at this time . Abdomen: soft . Skin: no rash seen on limited exam . Musculoskeletal: not rigid . Psychiatric:unable to assess . Neurologic: no seizure no involuntary movements         Lab Data:   Basic Metabolic Panel: Recent Labs  Lab 06/28/18 1033  NA 137  K 4.6  CL 103  CO2 24  GLUCOSE 134*  BUN 17  CREATININE 0.59*  CALCIUM 9.1    ABG: No results for input(s): PHART, PCO2ART, PO2ART, HCO3, O2SAT in the last 168 hours.  Liver Function Tests: No results for input(s): AST, ALT, ALKPHOS, BILITOT, PROT, ALBUMIN in the last 168 hours. No results for input(s): LIPASE, AMYLASE in the last 168 hours. No results for input(s): AMMONIA in the last 168 hours.  CBC: No results for input(s): WBC, NEUTROABS, HGB, HCT, MCV, PLT in the last 168 hours.  Cardiac  Enzymes: No results for input(s): CKTOTAL, CKMB, CKMBINDEX, TROPONINI in the last 168 hours.  BNP (last 3 results) No results for input(s): BNP in the last 8760 hours.  ProBNP (last 3 results) No results for input(s): PROBNP in the last 8760 hours.  Radiological Exams: No results found.  Assessment/Plan Active Problems:   Acute on chronic respiratory failure with hypoxia (HCC)   Chronic atrial fibrillation   Pneumonia of both lungs due to Pseudomonas species (HCC)   Pleural effusion, left   Muscular dystrophy (HCC)   1. Acute on chronic respiratory failure with hypoxia continue with pressure support mode for a goal of 12 hours as patient can tolerate.  May try to increase time on pressure support daily as patient may tolerate. 2. Chronic atrial fibrillation rate controlled 3. Pneumonia treated clinic improving 4. Pleural effusion will follow radiologically 5. Muscular dystrophy advanced disease continue supportive care   I have personally seen and evaluated the patient, evaluated laboratory and imaging results, formulated the assessment and plan and placed orders. The Patient requires high complexity decision making for assessment and support.  Case was discussed on Rounds with the Respiratory Therapy Staff  Yevonne Pax, MD [facility] Pulmonary Critical Care Medicine Sleep Medicine

## 2018-07-03 DIAGNOSIS — G7100 Muscular dystrophy, unspecified: Secondary | ICD-10-CM

## 2018-07-03 DIAGNOSIS — J90 Pleural effusion, not elsewhere classified: Secondary | ICD-10-CM

## 2018-07-03 DIAGNOSIS — J9621 Acute and chronic respiratory failure with hypoxia: Secondary | ICD-10-CM | POA: Diagnosis not present

## 2018-07-03 DIAGNOSIS — I482 Chronic atrial fibrillation, unspecified: Secondary | ICD-10-CM

## 2018-07-03 DIAGNOSIS — J151 Pneumonia due to Pseudomonas: Secondary | ICD-10-CM

## 2018-07-03 LAB — CULTURE, BODY FLUID W GRAM STAIN -BOTTLE: Culture: NO GROWTH

## 2018-07-03 NOTE — Progress Notes (Addendum)
Pulmonary Critical Care Medicine [facility] [place]   PULMONARY CRITICAL CARE SERVICE  PROGRESS NOTE  Date of Service: 07/03/2018  Luis Choi  [id number]  DOB: [birth year]   DOA: 06/24/2018  Referring Physician: Carron Curie, MD  HPI: Luis Choi is a 53 y.o. male seen for follow up of Acute on Chronic Respiratory Failure.  Patient tolerated 12 hours on pressure support yesterday.  Did 1 hour of ATC today and will now do 12 hours of pressure support.  When on pressure support will rest back on ventilator AC PC mode.  Medications: Reviewed on Rounds  Physical Exam:  Vitals: Pulse 82 respirations 20 BP 95/60 O2 sat 90% temp 96.2  Ventilator Settings patient is currently on pressure support however when resting on the vent at night its ventilator mode AC PC rate of 12 IP 12 PEEP of 5 FiO2 28%  . General: Comfortable at this time . Eyes: Grossly normal lids, irises & conjunctiva . ENT: grossly tongue is normal . Neck: no obvious mass . Cardiovascular: S1 S2 normal no gallop . Respiratory: No rales or rhonchi noted . Abdomen: soft . Skin: no rash seen on limited exam . Musculoskeletal: not rigid . Psychiatric:unable to assess . Neurologic: no seizure no involuntary movements         Lab Data:   Basic Metabolic Panel: Recent Labs  Lab 06/28/18 1033  NA 137  K 4.6  CL 103  CO2 24  GLUCOSE 134*  BUN 17  CREATININE 0.59*  CALCIUM 9.1    ABG: No results for input(s): PHART, PCO2ART, PO2ART, HCO3, O2SAT in the last 168 hours.  Liver Function Tests: No results for input(s): AST, ALT, ALKPHOS, BILITOT, PROT, ALBUMIN in the last 168 hours. No results for input(s): LIPASE, AMYLASE in the last 168 hours. No results for input(s): AMMONIA in the last 168 hours.  CBC: No results for input(s): WBC, NEUTROABS, HGB, HCT, MCV, PLT in the last 168 hours.  Cardiac Enzymes: No results for input(s): CKTOTAL, CKMB, CKMBINDEX, TROPONINI in the last 168  hours.  BNP (last 3 results) No results for input(s): BNP in the last 8760 hours.  ProBNP (last 3 results) No results for input(s): PROBNP in the last 8760 hours.  Radiological Exams: No results found.  Assessment/Plan Active Problems:   Acute on chronic respiratory failure with hypoxia (HCC)   Chronic atrial fibrillation   Pneumonia of both lungs due to Pseudomonas species (HCC)   Pleural effusion, left   Muscular dystrophy (HCC)   1. Acute on chronic respiratory failure with hypoxia continue with pressure support mode for goal of 12 hours as patient can tolerate.  Continue trach collar trials per protocol. 2. Chronic atrial fibrillation rate controlled 3. Pneumonia treated clinically improving 4. Pleural effusion will follow radiologically 5. Muscular dystrophy advanced disease continue supportive care   I have personally seen and evaluated the patient, evaluated laboratory and imaging results, formulated the assessment and plan and placed orders. The Patient requires high complexity decision making for assessment and support.  Case was discussed on Rounds with the Respiratory Therapy Staff  Yevonne Pax, MD [facility] Pulmonary Critical Care Medicine Sleep Medicine

## 2018-07-04 DIAGNOSIS — J9621 Acute and chronic respiratory failure with hypoxia: Secondary | ICD-10-CM | POA: Diagnosis not present

## 2018-07-04 DIAGNOSIS — I482 Chronic atrial fibrillation, unspecified: Secondary | ICD-10-CM | POA: Diagnosis not present

## 2018-07-04 DIAGNOSIS — G7100 Muscular dystrophy, unspecified: Secondary | ICD-10-CM | POA: Diagnosis not present

## 2018-07-04 DIAGNOSIS — J90 Pleural effusion, not elsewhere classified: Secondary | ICD-10-CM | POA: Diagnosis not present

## 2018-07-04 LAB — CBC
HEMATOCRIT: 33.8 % — AB (ref 39.0–52.0)
Hemoglobin: 10.2 g/dL — ABNORMAL LOW (ref 13.0–17.0)
MCH: 29.1 pg (ref 26.0–34.0)
MCHC: 30.2 g/dL (ref 30.0–36.0)
MCV: 96.6 fL (ref 80.0–100.0)
Platelets: 339 10*3/uL (ref 150–400)
RBC: 3.5 MIL/uL — ABNORMAL LOW (ref 4.22–5.81)
RDW: 14.2 % (ref 11.5–15.5)
WBC: 8.9 10*3/uL (ref 4.0–10.5)
nRBC: 0 % (ref 0.0–0.2)

## 2018-07-04 LAB — BASIC METABOLIC PANEL
Anion gap: 10 (ref 5–15)
BUN: 15 mg/dL (ref 6–20)
CO2: 24 mmol/L (ref 22–32)
Calcium: 8.5 mg/dL — ABNORMAL LOW (ref 8.9–10.3)
Chloride: 105 mmol/L (ref 98–111)
Creatinine, Ser: 0.77 mg/dL (ref 0.61–1.24)
GFR calc Af Amer: >60 mL/min (ref >60)
GFR calc non Af Amer: >60 mL/min (ref >60)
Glucose, Bld: 132 mg/dL — ABNORMAL HIGH (ref 70–99)
Potassium: 5 mmol/L (ref 3.5–5.1)
Sodium: 139 mmol/L (ref 135–145)

## 2018-07-04 LAB — GENTAMICIN LEVEL, TROUGH: Gentamicin Trough: 7.8 ug/mL (ref 0.5–2.0)

## 2018-07-04 NOTE — Progress Notes (Addendum)
Pulmonary Critical Care Medicine [facility] [place]   PULMONARY CRITICAL CARE SERVICE  PROGRESS NOTE  Date of Service: 07/04/2018  Luis Choi  [id number]  DOB: [birth year]   DOA: 06/24/2018  Referring Physician: Carron Curie, MD  HPI: Luis Choi is a 53 y.o. male seen for follow up of Acute on Chronic Respiratory Failure.  Patient was able to tolerate 2 hours today on ATC.  Is placed back on pressure control mode with an FiO2 of 28%.  Medications: Reviewed on Rounds  Physical Exam:  Vitals: Pulse 95 respirations 26 BP 105/75 O2 sat 92% temp 98.0  Ventilator Settings patient is currently on pressure control mode rate of 12 PC 12 PEEP of 520% FiO2  . General: Comfortable at this time . Eyes: Grossly normal lids, irises & conjunctiva . ENT: grossly tongue is normal . Neck: no obvious mass . Cardiovascular: S1 S2 normal no gallop . Respiratory: No rales or rhonchi noted . Abdomen: soft . Skin: no rash seen on limited exam . Musculoskeletal: not rigid . Psychiatric:unable to assess . Neurologic: no seizure no involuntary movements         Lab Data:   Basic Metabolic Panel: Recent Labs  Lab 06/28/18 1033 07/04/18 0513  NA 137 139  K 4.6 5.0  CL 103 105  CO2 24 24  GLUCOSE 134* 132*  BUN 17 15  CREATININE 0.59* 0.77  CALCIUM 9.1 8.5*    ABG: No results for input(s): PHART, PCO2ART, PO2ART, HCO3, O2SAT in the last 168 hours.  Liver Function Tests: No results for input(s): AST, ALT, ALKPHOS, BILITOT, PROT, ALBUMIN in the last 168 hours. No results for input(s): LIPASE, AMYLASE in the last 168 hours. No results for input(s): AMMONIA in the last 168 hours.  CBC: Recent Labs  Lab 07/04/18 0513  WBC 8.9  HGB 10.2*  HCT 33.8*  MCV 96.6  PLT 339    Cardiac Enzymes: No results for input(s): CKTOTAL, CKMB, CKMBINDEX, TROPONINI in the last 168 hours.  BNP (last 3 results) No results for input(s): BNP in the last 8760 hours.  ProBNP  (last 3 results) No results for input(s): PROBNP in the last 8760 hours.  Radiological Exams: No results found.  Assessment/Plan Active Problems:   Acute on chronic respiratory failure with hypoxia (HCC)   Chronic atrial fibrillation   Pneumonia of both lungs due to Pseudomonas species (HCC)   Pleural effusion, left   Muscular dystrophy (HCC)   1. Acute on chronic respiratory failure with hypoxia continue with pressure support mode for a goal of 12 hours as patient can tolerate.  Continue trach collar trials per protocol. 2. Chronic atrial fibrillation rate controlled 3. Pneumonia treated clinically improving 4. Pleural effusion will follow radiologically 5. Muscular dystrophy advanced disease continue supportive care   I have personally seen and evaluated the patient, evaluated laboratory and imaging results, formulated the assessment and plan and placed orders. The Patient requires high complexity decision making for assessment and support.  Case was discussed on Rounds with the Respiratory Therapy Staff  Yevonne Pax, MD [facility] Pulmonary Critical Care Medicine Sleep Medicine

## 2018-07-05 DIAGNOSIS — J9621 Acute and chronic respiratory failure with hypoxia: Secondary | ICD-10-CM | POA: Diagnosis not present

## 2018-07-05 DIAGNOSIS — J90 Pleural effusion, not elsewhere classified: Secondary | ICD-10-CM | POA: Diagnosis not present

## 2018-07-05 DIAGNOSIS — I482 Chronic atrial fibrillation, unspecified: Secondary | ICD-10-CM | POA: Diagnosis not present

## 2018-07-05 DIAGNOSIS — G7100 Muscular dystrophy, unspecified: Secondary | ICD-10-CM | POA: Diagnosis not present

## 2018-07-05 LAB — GENTAMICIN LEVEL, TROUGH: Gentamicin Trough: 0.8 ug/mL (ref 0.5–2.0)

## 2018-07-05 NOTE — Progress Notes (Signed)
Pulmonary Critical Care Medicine [facility] [place]   PULMONARY CRITICAL CARE SERVICE  PROGRESS NOTE  Date of Service: 07/05/2018  LAMARIO MU  [id number]  DOB: [birth year]   DOA: 06/24/2018  Referring Physician: Carron Curie, MD  HPI: Luis Choi is a 53 y.o. male seen for follow up of Acute on Chronic Respiratory Failure.  Patient currently is on T collar did about 4-hour goal  Medications: Reviewed on Rounds  Physical Exam:  Vitals: Temperature 97.3 pulse 70 respiratory 20 blood pressure 90/52 saturations 94%  Ventilator Settings currently on T collar 35% FiO2  . General: Comfortable at this time . Eyes: Grossly normal lids, irises & conjunctiva . ENT: grossly tongue is normal . Neck: no obvious mass . Cardiovascular: S1 S2 normal no gallop . Respiratory: No rhonchi or rales are noted at this time . Abdomen: soft . Skin: no rash seen on limited exam . Musculoskeletal: not rigid . Psychiatric:unable to assess . Neurologic: no seizure no involuntary movements         Lab Data:   Basic Metabolic Panel: Recent Labs  Lab 07/04/18 0513  NA 139  K 5.0  CL 105  CO2 24  GLUCOSE 132*  BUN 15  CREATININE 0.77  CALCIUM 8.5*    ABG: No results for input(s): PHART, PCO2ART, PO2ART, HCO3, O2SAT in the last 168 hours.  Liver Function Tests: No results for input(s): AST, ALT, ALKPHOS, BILITOT, PROT, ALBUMIN in the last 168 hours. No results for input(s): LIPASE, AMYLASE in the last 168 hours. No results for input(s): AMMONIA in the last 168 hours.  CBC: Recent Labs  Lab 07/04/18 0513  WBC 8.9  HGB 10.2*  HCT 33.8*  MCV 96.6  PLT 339    Cardiac Enzymes: No results for input(s): CKTOTAL, CKMB, CKMBINDEX, TROPONINI in the last 168 hours.  BNP (last 3 results) No results for input(s): BNP in the last 8760 hours.  ProBNP (last 3 results) No results for input(s): PROBNP in the last 8760 hours.  Radiological Exams: No results  found.  Assessment/Plan Active Problems:   Acute on chronic respiratory failure with hypoxia (HCC)   Chronic atrial fibrillation   Pneumonia of both lungs due to Pseudomonas species (HCC)   Pleural effusion, left   Muscular dystrophy (HCC)   1. Acute on chronic respiratory failure with hypoxia we will continue with T collar titrate oxygen as tolerated continue pulmonary toilet 2. Chronic atrial fibrillation rate is controlled 3. Pneumonia due to Pseudomonas treated improved 4. Left-sided pleural effusion follow radiologically 5. Muscular dystrophy patient and baseline   I have personally seen and evaluated the patient, evaluated laboratory and imaging results, formulated the assessment and plan and placed orders. The Patient requires high complexity decision making for assessment and support.  Case was discussed on Rounds with the Respiratory Therapy Staff  Yevonne Pax, MD [facility] Pulmonary Critical Care Medicine Sleep Medicine

## 2018-07-06 DIAGNOSIS — J9621 Acute and chronic respiratory failure with hypoxia: Secondary | ICD-10-CM | POA: Diagnosis not present

## 2018-07-06 DIAGNOSIS — I482 Chronic atrial fibrillation, unspecified: Secondary | ICD-10-CM | POA: Diagnosis not present

## 2018-07-06 DIAGNOSIS — J90 Pleural effusion, not elsewhere classified: Secondary | ICD-10-CM | POA: Diagnosis not present

## 2018-07-06 DIAGNOSIS — G7100 Muscular dystrophy, unspecified: Secondary | ICD-10-CM | POA: Diagnosis not present

## 2018-07-06 NOTE — Progress Notes (Signed)
Pulmonary Critical Care Medicine [facility] [place]   PULMONARY CRITICAL CARE SERVICE  PROGRESS NOTE  Date of Service: 07/06/2018  ARMONI GALANTE  WNU:[id number]  DOB: 1965-06-25   DOA: 06/24/2018  Referring Physician: Carron Curie, MD  HPI: RAS SWINDELL is a 53 y.o. male seen for follow up of Acute on Chronic Respiratory Failure.  Patient is on T collar now 35% FiO2 with a goal of 8 hours  Medications: Reviewed on Rounds  Physical Exam:  Vitals: Temperature 97.5 pulse 76 respiratory 16 blood pressure 92/58 saturations 94%  Ventilator Settings on T collar FiO2 35%  . General: Comfortable at this time . Eyes: Grossly normal lids, irises & conjunctiva . ENT: grossly tongue is normal . Neck: no obvious mass . Cardiovascular: S1 S2 normal no gallop . Respiratory: No rhonchi or rales are noted . Abdomen: soft . Skin: no rash seen on limited exam . Musculoskeletal: not rigid . Psychiatric:unable to assess . Neurologic: no seizure no involuntary movements         Lab Data:   Basic Metabolic Panel: Recent Labs  Lab 07/04/18 0513  NA 139  K 5.0  CL 105  CO2 24  GLUCOSE 132*  BUN 15  CREATININE 0.77  CALCIUM 8.5*    ABG: No results for input(s): PHART, PCO2ART, PO2ART, HCO3, O2SAT in the last 168 hours.  Liver Function Tests: No results for input(s): AST, ALT, ALKPHOS, BILITOT, PROT, ALBUMIN in the last 168 hours. No results for input(s): LIPASE, AMYLASE in the last 168 hours. No results for input(s): AMMONIA in the last 168 hours.  CBC: Recent Labs  Lab 07/04/18 0513  WBC 8.9  HGB 10.2*  HCT 33.8*  MCV 96.6  PLT 339    Cardiac Enzymes: No results for input(s): CKTOTAL, CKMB, CKMBINDEX, TROPONINI in the last 168 hours.  BNP (last 3 results) No results for input(s): BNP in the last 8760 hours.  ProBNP (last 3 results) No results for input(s): PROBNP in the last 8760 hours.  Radiological Exams: No results  found.  Assessment/Plan Active Problems:   Acute on chronic respiratory failure with hypoxia (HCC)   Chronic atrial fibrillation   Pneumonia of both lungs due to Pseudomonas species (HCC)   Pleural effusion, left   Muscular dystrophy (HCC)   1. Acute on chronic respiratory failure with hypoxia we will continue with T collar trials continue pulmonary toilet supportive care. 2. Chronic atrial fibrillation rate is controlled at this time 3. Pneumonia due to Pseudomonas treated we will continue to follow 4. Pleural effusion at baseline 5. Muscular dystrophy at baseline   I have personally seen and evaluated the patient, evaluated laboratory and imaging results, formulated the assessment and plan and placed orders. The Patient requires high complexity decision making for assessment and support.  Case was discussed on Rounds with the Respiratory Therapy Staff  Yevonne Pax, MD [facility] Pulmonary Critical Care Medicine Sleep Medicine

## 2018-07-07 DIAGNOSIS — G7100 Muscular dystrophy, unspecified: Secondary | ICD-10-CM | POA: Diagnosis not present

## 2018-07-07 DIAGNOSIS — J90 Pleural effusion, not elsewhere classified: Secondary | ICD-10-CM | POA: Diagnosis not present

## 2018-07-07 DIAGNOSIS — I482 Chronic atrial fibrillation, unspecified: Secondary | ICD-10-CM | POA: Diagnosis not present

## 2018-07-07 DIAGNOSIS — J9621 Acute and chronic respiratory failure with hypoxia: Secondary | ICD-10-CM | POA: Diagnosis not present

## 2018-07-07 NOTE — Progress Notes (Addendum)
Pulmonary Critical Care Medicine [facility] [place]   PULMONARY CRITICAL CARE SERVICE  PROGRESS NOTE  Date of Service: 07/07/2018  Luis Choi  [id number]:[id number]  DOB: Mar 02, 1966   DOA: 06/24/2018  Referring Physician: Carron Curie, MD  HPI: Luis Choi is a 53 y.o. male seen for follow up of Acute on Chronic Respiratory Failure.  Patient had 12-hour goal today on trach collar 35% FiO2.  Currently doing well and satting well at this time.  Medications: Reviewed on Rounds  Physical Exam:  Vitals: Pulse 80 respirations 22 BP 98/56 O2 sat 93% temp 97.9  Ventilator Settings not currently on ventilator  . General: Comfortable at this time . Eyes: Grossly normal lids, irises & conjunctiva . ENT: grossly tongue is normal . Neck: no obvious mass . Cardiovascular: S1 S2 normal no gallop . Respiratory: No rales or rhonchi noted . Abdomen: soft . Skin: no rash seen on limited exam . Musculoskeletal: not rigid . Psychiatric:unable to assess . Neurologic: no seizure no involuntary movements         Lab Data:   Basic Metabolic Panel: Recent Labs  Lab 07/04/18 0513  NA 139  K 5.0  CL 105  CO2 24  GLUCOSE 132*  BUN 15  CREATININE 0.77  CALCIUM 8.5*    ABG: No results for input(s): PHART, PCO2ART, PO2ART, HCO3, O2SAT in the last 168 hours.  Liver Function Tests: No results for input(s): AST, ALT, ALKPHOS, BILITOT, PROT, ALBUMIN in the last 168 hours. No results for input(s): LIPASE, AMYLASE in the last 168 hours. No results for input(s): AMMONIA in the last 168 hours.  CBC: Recent Labs  Lab 07/04/18 0513  WBC 8.9  HGB 10.2*  HCT 33.8*  MCV 96.6  PLT 339    Cardiac Enzymes: No results for input(s): CKTOTAL, CKMB, CKMBINDEX, TROPONINI in the last 168 hours.  BNP (last 3 results) No results for input(s): BNP in the last 8760 hours.  ProBNP (last 3 results) No results for input(s): PROBNP in the last 8760 hours.  Radiological Exams: No results  found.  Assessment/Plan Active Problems:   Acute on chronic respiratory failure with hypoxia (HCC)   Chronic atrial fibrillation   Pneumonia of both lungs due to Pseudomonas species (HCC)   Pleural effusion, left   Muscular dystrophy (HCC)   1. Acute on chronic respiratory failure with hypoxia continue with T collar trials as well as pulmonary toilet and supportive measures. 2. Chronic atrial fibrillation rate controlled at this time. 3. Pneumonia due to Pseudomonas treated continue to follow 4. Pleural effusion at baseline 5. Muscular dystrophy at baseline   I have personally seen and evaluated the patient, evaluated laboratory and imaging results, formulated the assessment and plan and placed orders. The Patient requires high complexity decision making for assessment and support.  Case was discussed on Rounds with the Respiratory Therapy Staff  Yevonne Pax, MD [facility] Pulmonary Critical Care Medicine Sleep Medicine

## 2018-07-08 DIAGNOSIS — J90 Pleural effusion, not elsewhere classified: Secondary | ICD-10-CM | POA: Diagnosis not present

## 2018-07-08 DIAGNOSIS — J9621 Acute and chronic respiratory failure with hypoxia: Secondary | ICD-10-CM | POA: Diagnosis not present

## 2018-07-08 DIAGNOSIS — G7100 Muscular dystrophy, unspecified: Secondary | ICD-10-CM | POA: Diagnosis not present

## 2018-07-08 DIAGNOSIS — I482 Chronic atrial fibrillation, unspecified: Secondary | ICD-10-CM | POA: Diagnosis not present

## 2018-07-08 NOTE — Progress Notes (Signed)
Pulmonary Critical Care Medicine [facility] [place]   PULMONARY CRITICAL CARE SERVICE  PROGRESS NOTE  Date of Service: 07/08/2018  Luis Choi  [id number]  DOB: [birth year]   DOA: 06/24/2018  Referring Physician: Carron Curie, MD  HPI: Luis Choi is a 54 y.o. male seen for follow up of Acute on Chronic Respiratory Failure.  Patient is on T collar greater than 35% FiO2 the goal is for 16 hours  Medications: Reviewed on Rounds  Physical Exam:  Vitals: Temperature 97.9 pulse 88 respiratory rate 24 blood pressure 112/55 saturations 99%  Ventilator Settings on T collar FiO2 35%  . General: Comfortable at this time . Eyes: Grossly normal lids, irises & conjunctiva . ENT: grossly tongue is normal . Neck: no obvious mass . Cardiovascular: S1 S2 normal no gallop . Respiratory: No rhonchi or rales are noted . Abdomen: soft . Skin: no rash seen on limited exam . Musculoskeletal: not rigid . Psychiatric:unable to assess . Neurologic: no seizure no involuntary movements         Lab Data:   Basic Metabolic Panel: Recent Labs  Lab 07/04/18 0513  NA 139  K 5.0  CL 105  CO2 24  GLUCOSE 132*  BUN 15  CREATININE 0.77  CALCIUM 8.5*    ABG: No results for input(s): PHART, PCO2ART, PO2ART, HCO3, O2SAT in the last 168 hours.  Liver Function Tests: No results for input(s): AST, ALT, ALKPHOS, BILITOT, PROT, ALBUMIN in the last 168 hours. No results for input(s): LIPASE, AMYLASE in the last 168 hours. No results for input(s): AMMONIA in the last 168 hours.  CBC: Recent Labs  Lab 07/04/18 0513  WBC 8.9  HGB 10.2*  HCT 33.8*  MCV 96.6  PLT 339    Cardiac Enzymes: No results for input(s): CKTOTAL, CKMB, CKMBINDEX, TROPONINI in the last 168 hours.  BNP (last 3 results) No results for input(s): BNP in the last 8760 hours.  ProBNP (last 3 results) No results for input(s): PROBNP in the last 8760 hours.  Radiological Exams: No results  found.  Assessment/Plan Active Problems:   Acute on chronic respiratory failure with hypoxia (HCC)   Chronic atrial fibrillation   Pneumonia of both lungs due to Pseudomonas species (HCC)   Pleural effusion, left   Muscular dystrophy (HCC)   1. Acute on chronic respiratory failure with hypoxia we will continue with T collar trials as mentioned above the goal is 16 hours 2. Chronic atrial fibrillation rate controlled 3. Pneumonia treated the secondary to Pseudomonas 4. Pleural effusion follow radiologically 5. Muscular dystrophy patient is at baseline   I have personally seen and evaluated the patient, evaluated laboratory and imaging results, formulated the assessment and plan and placed orders. The Patient requires high complexity decision making for assessment and support.  Case was discussed on Rounds with the Respiratory Therapy Staff  Yevonne Pax, MD [facility] Pulmonary Critical Care Medicine Sleep Medicine

## 2018-07-09 DIAGNOSIS — G7100 Muscular dystrophy, unspecified: Secondary | ICD-10-CM | POA: Diagnosis not present

## 2018-07-09 DIAGNOSIS — J90 Pleural effusion, not elsewhere classified: Secondary | ICD-10-CM | POA: Diagnosis not present

## 2018-07-09 DIAGNOSIS — I482 Chronic atrial fibrillation, unspecified: Secondary | ICD-10-CM | POA: Diagnosis not present

## 2018-07-09 DIAGNOSIS — J9621 Acute and chronic respiratory failure with hypoxia: Secondary | ICD-10-CM | POA: Diagnosis not present

## 2018-07-09 NOTE — Progress Notes (Addendum)
Pulmonary Critical Care Medicine [facility] [place]   PULMONARY CRITICAL CARE SERVICE  PROGRESS NOTE  Date of Service: 07/09/2018  Luis Choi  [id number]  DOB: Mar 21, 1966   DOA: 06/24/2018  Referring Physician: Carron Curie, MD  HPI: Luis Choi is a 53 y.o. male seen for follow up of Acute on Chronic Respiratory Failure.  Patient has a goal of 16 to 20 hours today on aerosol trach collar FiO2 40%.  Once he completes that goal who is back on the ventilator to rest.  Currently doing well with no acute distress noted.  Medications: Reviewed on Rounds  Physical Exam:  Vitals: Pulse 72 respiration 17 BP 97/57 O2 sat 93% temp 98.3  Ventilator Settings patient currently on ATC 40% FiO2 however will rest on the ventilator mode AC PC rate of 12 IP 12 PEEP of 5 FiO2 28%  . General: Comfortable at this time . Eyes: Grossly normal lids, irises & conjunctiva . ENT: grossly tongue is normal . Neck: no obvious mass . Cardiovascular: S1 S2 normal no gallop . Respiratory: No rales or rhonchi noted . Abdomen: soft . Skin: no rash seen on limited exam . Musculoskeletal: not rigid . Psychiatric:unable to assess . Neurologic: no seizure no involuntary movements         Lab Data:   Basic Metabolic Panel: Recent Labs  Lab 07/04/18 0513  NA 139  K 5.0  CL 105  CO2 24  GLUCOSE 132*  BUN 15  CREATININE 0.77  CALCIUM 8.5*    ABG: No results for input(s): PHART, PCO2ART, PO2ART, HCO3, O2SAT in the last 168 hours.  Liver Function Tests: No results for input(s): AST, ALT, ALKPHOS, BILITOT, PROT, ALBUMIN in the last 168 hours. No results for input(s): LIPASE, AMYLASE in the last 168 hours. No results for input(s): AMMONIA in the last 168 hours.  CBC: Recent Labs  Lab 07/04/18 0513  WBC 8.9  HGB 10.2*  HCT 33.8*  MCV 96.6  PLT 339    Cardiac Enzymes: No results for input(s): CKTOTAL, CKMB, CKMBINDEX, TROPONINI in the last 168 hours.  BNP (last 3  results) No results for input(s): BNP in the last 8760 hours.  ProBNP (last 3 results) No results for input(s): PROBNP in the last 8760 hours.  Radiological Exams: No results found.  Assessment/Plan Active Problems:   Acute on chronic respiratory failure with hypoxia (HCC)   Chronic atrial fibrillation   Pneumonia of both lungs due to Pseudomonas species (HCC)   Pleural effusion, left   Muscular dystrophy (HCC)   1. Acute on chronic respiratory failure with hypoxia continue with T collar trials as mentioned above with a goal of 16-20 hours today.  Patient will return to ventilator after that. 2. Chronic atrial fibrillation rate controlled 3. Pneumonia treated for secondary Pseudomonas. 4. Pleural effusion follow radiologically 5. Muscular dystrophy patient baseline   I have personally seen and evaluated the patient, evaluated laboratory and imaging results, formulated the assessment and plan and placed orders. The Patient requires high complexity decision making for assessment and support.  Case was discussed on Rounds with the Respiratory Therapy Staff  Yevonne Pax, MD [facility] Pulmonary Critical Care Medicine Sleep Medicine

## 2018-07-10 DIAGNOSIS — I482 Chronic atrial fibrillation, unspecified: Secondary | ICD-10-CM | POA: Diagnosis not present

## 2018-07-10 DIAGNOSIS — G7100 Muscular dystrophy, unspecified: Secondary | ICD-10-CM | POA: Diagnosis not present

## 2018-07-10 DIAGNOSIS — J9621 Acute and chronic respiratory failure with hypoxia: Secondary | ICD-10-CM | POA: Diagnosis not present

## 2018-07-10 DIAGNOSIS — J90 Pleural effusion, not elsewhere classified: Secondary | ICD-10-CM | POA: Diagnosis not present

## 2018-07-10 NOTE — Progress Notes (Addendum)
Pulmonary Critical Care Medicine [facility] [place]   PULMONARY CRITICAL CARE SERVICE  PROGRESS NOTE  Date of Service: 07/10/2018  JHAYDEN KUTCHER  ACZ:[id number]  DOB: [birth year]   DOA: 06/24/2018  Referring Physician: Carron Curie, MD  HPI: Luis EILTS is a 53 y.o. male seen for follow up of Acute on Chronic Respiratory Failure.  Patient was only able to tolerate 13 hours yesterday on trach collar.  Trying again today at this time.  The patient was requesting to come off the trach collar and get on the vent yesterday afternoon.  Overall he is doing well.  Medications: Reviewed on Rounds  Physical Exam:  Vitals: Pulse 78 respirations 15 BP 104/62 O2 sat 94% temp 98.6  Ventilator Settings currently patient is on T collar 40% when off of T collar he is on ventilator mode AC PC rate of 12 IP 12 PEEP of 5 FiO2 28%  . General: Comfortable at this time . Eyes: Grossly normal lids, irises & conjunctiva . ENT: grossly tongue is normal . Neck: no obvious mass . Cardiovascular: S1 S2 normal no gallop . Respiratory: No rales or rhonchi noted . Abdomen: soft . Skin: no rash seen on limited exam . Musculoskeletal: not rigid . Psychiatric:unable to assess . Neurologic: no seizure no involuntary movements         Lab Data:   Basic Metabolic Panel: Recent Labs  Lab 07/04/18 0513  NA 139  K 5.0  CL 105  CO2 24  GLUCOSE 132*  BUN 15  CREATININE 0.77  CALCIUM 8.5*    ABG: No results for input(s): PHART, PCO2ART, PO2ART, HCO3, O2SAT in the last 168 hours.  Liver Function Tests: No results for input(s): AST, ALT, ALKPHOS, BILITOT, PROT, ALBUMIN in the last 168 hours. No results for input(s): LIPASE, AMYLASE in the last 168 hours. No results for input(s): AMMONIA in the last 168 hours.  CBC: Recent Labs  Lab 07/04/18 0513  WBC 8.9  HGB 10.2*  HCT 33.8*  MCV 96.6  PLT 339    Cardiac Enzymes: No results for input(s): CKTOTAL, CKMB, CKMBINDEX, TROPONINI in  the last 168 hours.  BNP (last 3 results) No results for input(s): BNP in the last 8760 hours.  ProBNP (last 3 results) No results for input(s): PROBNP in the last 8760 hours.  Radiological Exams: No results found.  Assessment/Plan Active Problems:   Acute on chronic respiratory failure with hypoxia (HCC)   Chronic atrial fibrillation   Pneumonia of both lungs due to Pseudomonas species (HCC)   Pleural effusion, left   Muscular dystrophy (HCC)   1. Acute on chronic respiratory failure with hypoxia continue with T collar trials as mentioned above with a goal of 16 to 20 hours a day.  Patient will return on ventilator after that. 2. Chronic atrial fibrillation rate controlled 3. Pneumonia treated for secondary Pseudomonas 4. Pleural effusion follow radiologically 5. Muscular dystrophy patient baseline   I have personally seen and evaluated the patient, evaluated laboratory and imaging results, formulated the assessment and plan and placed orders. The Patient requires high complexity decision making for assessment and support.  Case was discussed on Rounds with the Respiratory Therapy Staff  Yevonne Pax, MD [facility] Pulmonary Critical Care Medicine Sleep Medicine

## 2018-07-11 DIAGNOSIS — I482 Chronic atrial fibrillation, unspecified: Secondary | ICD-10-CM | POA: Diagnosis not present

## 2018-07-11 DIAGNOSIS — J9621 Acute and chronic respiratory failure with hypoxia: Secondary | ICD-10-CM | POA: Diagnosis not present

## 2018-07-11 DIAGNOSIS — J90 Pleural effusion, not elsewhere classified: Secondary | ICD-10-CM | POA: Diagnosis not present

## 2018-07-11 DIAGNOSIS — G7100 Muscular dystrophy, unspecified: Secondary | ICD-10-CM | POA: Diagnosis not present

## 2018-07-11 NOTE — Progress Notes (Signed)
Pulmonary Critical Care Medicine [facility] [place]   PULMONARY CRITICAL CARE SERVICE  PROGRESS NOTE  Date of Service: 07/11/2018  Luis Choi  [id number]:[id number]  DOB: Feb 07, 1966   DOA: 06/24/2018  Referring Physician: Carron Curie, MD  HPI: Luis Choi is a 53 y.o. male seen for follow up of Acute on Chronic Respiratory Failure.  Patient currently is on T collar again the goal is for 24 hours seems to be doing well so far.  Medications: Reviewed on Rounds  Physical Exam:  Vitals: Temperature 96.5 pulse 85 respiratory rate 17 blood pressure 113/65 saturations 95%  Ventilator Settings currently on T collar FiO2 28%  . General: Comfortable at this time . Eyes: Grossly normal lids, irises & conjunctiva . ENT: grossly tongue is normal . Neck: no obvious mass . Cardiovascular: S1 S2 normal no gallop . Respiratory: No rhonchi or rales are noted . Abdomen: soft . Skin: no rash seen on limited exam . Musculoskeletal: not rigid . Psychiatric:unable to assess . Neurologic: no seizure no involuntary movements         Lab Data:   Basic Metabolic Panel: No results for input(s): NA, K, CL, CO2, GLUCOSE, BUN, CREATININE, CALCIUM, MG, PHOS in the last 168 hours.  ABG: No results for input(s): PHART, PCO2ART, PO2ART, HCO3, O2SAT in the last 168 hours.  Liver Function Tests: No results for input(s): AST, ALT, ALKPHOS, BILITOT, PROT, ALBUMIN in the last 168 hours. No results for input(s): LIPASE, AMYLASE in the last 168 hours. No results for input(s): AMMONIA in the last 168 hours.  CBC: No results for input(s): WBC, NEUTROABS, HGB, HCT, MCV, PLT in the last 168 hours.  Cardiac Enzymes: No results for input(s): CKTOTAL, CKMB, CKMBINDEX, TROPONINI in the last 168 hours.  BNP (last 3 results) No results for input(s): BNP in the last 8760 hours.  ProBNP (last 3 results) No results for input(s): PROBNP in the last 8760 hours.  Radiological Exams: No results  found.  Assessment/Plan Active Problems:   Acute on chronic respiratory failure with hypoxia (HCC)   Chronic atrial fibrillation   Pneumonia of both lungs due to Pseudomonas species (HCC)   Pleural effusion, left   Muscular dystrophy (HCC)   1. Acute on chronic respiratory failure with hypoxia continue with T collar trials continue pulmonary toilet supportive care 2. Chronic atrial fibrillation rate control 3. Pneumonia treated continue to follow 4. Pleural effusion follow-up radiologically 5. Muscular dystrophy patient is unchanged   I have personally seen and evaluated the patient, evaluated laboratory and imaging results, formulated the assessment and plan and placed orders. The Patient requires high complexity decision making for assessment and support.  Case was discussed on Rounds with the Respiratory Therapy Staff  Yevonne Pax, MD [facility] Pulmonary Critical Care Medicine Sleep Medicine

## 2018-07-12 DIAGNOSIS — I482 Chronic atrial fibrillation, unspecified: Secondary | ICD-10-CM | POA: Diagnosis not present

## 2018-07-12 DIAGNOSIS — J9621 Acute and chronic respiratory failure with hypoxia: Secondary | ICD-10-CM | POA: Diagnosis not present

## 2018-07-12 DIAGNOSIS — J90 Pleural effusion, not elsewhere classified: Secondary | ICD-10-CM | POA: Diagnosis not present

## 2018-07-12 DIAGNOSIS — G7100 Muscular dystrophy, unspecified: Secondary | ICD-10-CM | POA: Diagnosis not present

## 2018-07-12 LAB — CBC
HCT: 37.2 % — ABNORMAL LOW (ref 39.0–52.0)
Hemoglobin: 11.2 g/dL — ABNORMAL LOW (ref 13.0–17.0)
MCH: 29.2 pg (ref 26.0–34.0)
MCHC: 30.1 g/dL (ref 30.0–36.0)
MCV: 96.9 fL (ref 80.0–100.0)
Platelets: 427 10*3/uL — ABNORMAL HIGH (ref 150–400)
RBC: 3.84 MIL/uL — ABNORMAL LOW (ref 4.22–5.81)
RDW: 13.6 % (ref 11.5–15.5)
WBC: 8.4 10*3/uL (ref 4.0–10.5)
nRBC: 0 % (ref 0.0–0.2)

## 2018-07-12 LAB — BASIC METABOLIC PANEL
Anion gap: 9 (ref 5–15)
BUN: 20 mg/dL (ref 6–20)
CHLORIDE: 102 mmol/L (ref 98–111)
CO2: 30 mmol/L (ref 22–32)
Calcium: 9.5 mg/dL (ref 8.9–10.3)
Creatinine, Ser: 0.89 mg/dL (ref 0.61–1.24)
GFR calc Af Amer: >60 mL/min (ref >60)
GFR calc non Af Amer: >60 mL/min (ref >60)
Glucose, Bld: 117 mg/dL — ABNORMAL HIGH (ref 70–99)
Potassium: 4.3 mmol/L (ref 3.5–5.1)
Sodium: 141 mmol/L (ref 135–145)

## 2018-07-12 NOTE — Progress Notes (Addendum)
Pulmonary Critical Care Medicine [facility] [place]   PULMONARY CRITICAL CARE SERVICE  PROGRESS NOTE  Date of Service: 07/12/2018  Luis Choi  [id number]  DOB: June 02, 1965   DOA: 06/24/2018  Referring Physician: Carron Curie, MD  HPI: Luis Choi is a 53 y.o. male seen for follow up of Acute on Chronic Respiratory Failure.  Patient has now been on aerosol trach collar for 24 hours.  He is working towards 48 at this time.  Remains on FiO2 of 35%.  Medications: Reviewed on Rounds  Physical Exam:  Vitals: Pulse 77 respirations 15 BP 135/72 O2 sat 93% temp 98.2  Ventilator Settings patient's not currently on ventilator  . General: Comfortable at this time . Eyes: Grossly normal lids, irises & conjunctiva . ENT: grossly tongue is normal . Neck: no obvious mass . Cardiovascular: S1 S2 normal no gallop . Respiratory: No rales or rhonchi noted . Abdomen: soft . Skin: no rash seen on limited exam . Musculoskeletal: not rigid . Psychiatric:unable to assess . Neurologic: no seizure no involuntary movements         Lab Data:   Basic Metabolic Panel: Recent Labs  Lab 07/12/18 0555  NA 141  K 4.3  CL 102  CO2 30  GLUCOSE 117*  BUN 20  CREATININE 0.89  CALCIUM 9.5    ABG: No results for input(s): PHART, PCO2ART, PO2ART, HCO3, O2SAT in the last 168 hours.  Liver Function Tests: No results for input(s): AST, ALT, ALKPHOS, BILITOT, PROT, ALBUMIN in the last 168 hours. No results for input(s): LIPASE, AMYLASE in the last 168 hours. No results for input(s): AMMONIA in the last 168 hours.  CBC: Recent Labs  Lab 07/12/18 0555  WBC 8.4  HGB 11.2*  HCT 37.2*  MCV 96.9  PLT 427*    Cardiac Enzymes: No results for input(s): CKTOTAL, CKMB, CKMBINDEX, TROPONINI in the last 168 hours.  BNP (last 3 results) No results for input(s): BNP in the last 8760 hours.  ProBNP (last 3 results) No results for input(s): PROBNP in the last 8760  hours.  Radiological Exams: No results found.  Assessment/Plan Active Problems:   Acute on chronic respiratory failure with hypoxia (HCC)   Chronic atrial fibrillation   Pneumonia of both lungs due to Pseudomonas species (HCC)   Pleural effusion, left   Muscular dystrophy (HCC)   1. Acute on chronic respiratory failure with hypoxia continue with T collar trials and pulmonary toilet and supportive measures. 2. Chronic atrial fibrillation rate controlled 3. Pneumonia treated continue to follow 4. Pleural effusion follow-up radiologically 5. Muscular dystrophy patient is unchanged   I have personally seen and evaluated the patient, evaluated laboratory and imaging results, formulated the assessment and plan and placed orders. The Patient requires high complexity decision making for assessment and support.  Case was discussed on Rounds with the Respiratory Therapy Staff  Yevonne Pax, MD [facility] Pulmonary Critical Care Medicine Sleep Medicine

## 2018-07-13 ENCOUNTER — Other Ambulatory Visit (HOSPITAL_COMMUNITY): Payer: Self-pay

## 2018-07-13 DIAGNOSIS — G7100 Muscular dystrophy, unspecified: Secondary | ICD-10-CM | POA: Diagnosis not present

## 2018-07-13 DIAGNOSIS — J9621 Acute and chronic respiratory failure with hypoxia: Secondary | ICD-10-CM | POA: Diagnosis not present

## 2018-07-13 DIAGNOSIS — J90 Pleural effusion, not elsewhere classified: Secondary | ICD-10-CM | POA: Diagnosis not present

## 2018-07-13 DIAGNOSIS — I482 Chronic atrial fibrillation, unspecified: Secondary | ICD-10-CM | POA: Diagnosis not present

## 2018-07-13 NOTE — Progress Notes (Addendum)
Pulmonary Critical Care Medicine [facility] [place]   PULMONARY CRITICAL CARE SERVICE  PROGRESS NOTE  Date of Service: 07/13/2018  Luis Choi  WFU:[id number]  DOB: 08-11-1965   DOA: 06/24/2018  Referring Physician: Carron Curie, MD  HPI: Luis Choi is a 53 y.o. male seen for follow up of Acute on Chronic Respiratory Failure.  Patient remains on T collar 35% FiO2 using his PMV with no difficulty.  Medications: Reviewed on Rounds  Physical Exam:  Vitals: Pulse 89 respirations 20 BP 99/63 O2 sat 95% temp 97.1  Ventilator Settings not currently on ventilator  . General: Comfortable at this time . Eyes: Grossly normal lids, irises & conjunctiva . ENT: grossly tongue is normal . Neck: no obvious mass . Cardiovascular: S1 S2 normal no gallop . Respiratory: Rales or rhonchi noted . Abdomen: soft . Skin: no rash seen on limited exam . Musculoskeletal: not rigid . Psychiatric:unable to assess . Neurologic: no seizure no involuntary movements         Lab Data:   Basic Metabolic Panel: Recent Labs  Lab 07/12/18 0555  NA 141  K 4.3  CL 102  CO2 30  GLUCOSE 117*  BUN 20  CREATININE 0.89  CALCIUM 9.5    ABG: No results for input(s): PHART, PCO2ART, PO2ART, HCO3, O2SAT in the last 168 hours.  Liver Function Tests: No results for input(s): AST, ALT, ALKPHOS, BILITOT, PROT, ALBUMIN in the last 168 hours. No results for input(s): LIPASE, AMYLASE in the last 168 hours. No results for input(s): AMMONIA in the last 168 hours.  CBC: Recent Labs  Lab 07/12/18 0555  WBC 8.4  HGB 11.2*  HCT 37.2*  MCV 96.9  PLT 427*    Cardiac Enzymes: No results for input(s): CKTOTAL, CKMB, CKMBINDEX, TROPONINI in the last 168 hours.  BNP (last 3 results) No results for input(s): BNP in the last 8760 hours.  ProBNP (last 3 results) No results for input(s): PROBNP in the last 8760 hours.  Radiological Exams: No results found.  Assessment/Plan Active  Problems:   Acute on chronic respiratory failure with hypoxia (HCC)   Chronic atrial fibrillation   Pneumonia of both lungs due to Pseudomonas species (HCC)   Pleural effusion, left   Muscular dystrophy (HCC)   1. Acute on chronic respiratory failure with hypoxia continue with T collar trials and pulmonary toilet and supportive measures 2. Chronic atrial fibrillation rate controlled 3. Pneumonia treated continue to follow 4. Pleural effusion follow-up radiologically 5. Muscular dystrophy patient is unchanged   I have personally seen and evaluated the patient, evaluated laboratory and imaging results, formulated the assessment and plan and placed orders. The Patient requires high complexity decision making for assessment and support.  Case was discussed on Rounds with the Respiratory Therapy Staff  Yevonne Pax, MD [facility] Pulmonary Critical Care Medicine Sleep Medicine

## 2018-07-14 DIAGNOSIS — J9621 Acute and chronic respiratory failure with hypoxia: Secondary | ICD-10-CM | POA: Diagnosis not present

## 2018-07-14 DIAGNOSIS — J90 Pleural effusion, not elsewhere classified: Secondary | ICD-10-CM | POA: Diagnosis not present

## 2018-07-14 DIAGNOSIS — G7100 Muscular dystrophy, unspecified: Secondary | ICD-10-CM | POA: Diagnosis not present

## 2018-07-14 DIAGNOSIS — I482 Chronic atrial fibrillation, unspecified: Secondary | ICD-10-CM | POA: Diagnosis not present

## 2018-07-14 NOTE — Progress Notes (Signed)
Pulmonary Critical Care Medicine [facility] [place]   PULMONARY CRITICAL CARE SERVICE  PROGRESS NOTE  Date of Service: 07/14/2018  Luis Choi  [id number]:[id number]  DOB: [birth year]   DOA: 06/24/2018  Referring Physician: Carron Curie, MD  HPI: Luis Choi is a 53 y.o. male seen for follow up of Acute on Chronic Respiratory Failure.  Patient is on T collar with the PMV doing fairly well  Medications: Reviewed on Rounds  Physical Exam:  Vitals: Temperature 98.5 pulse 72 respiratory rate 18 blood pressure is 99/64 saturations 95%  Ventilator Settings currently on T collar FiO2 35% with PMV  . General: Comfortable at this time . Eyes: Grossly normal lids, irises & conjunctiva . ENT: grossly tongue is normal . Neck: no obvious mass . Cardiovascular: S1 S2 normal no gallop . Respiratory: No rhonchi or rales are noted at this time . Abdomen: soft . Skin: no rash seen on limited exam . Musculoskeletal: not rigid . Psychiatric:unable to assess . Neurologic: no seizure no involuntary movements         Lab Data:   Basic Metabolic Panel: Recent Labs  Lab 07/12/18 0555  NA 141  K 4.3  CL 102  CO2 30  GLUCOSE 117*  BUN 20  CREATININE 0.89  CALCIUM 9.5    ABG: No results for input(s): PHART, PCO2ART, PO2ART, HCO3, O2SAT in the last 168 hours.  Liver Function Tests: No results for input(s): AST, ALT, ALKPHOS, BILITOT, PROT, ALBUMIN in the last 168 hours. No results for input(s): LIPASE, AMYLASE in the last 168 hours. No results for input(s): AMMONIA in the last 168 hours.  CBC: Recent Labs  Lab 07/12/18 0555  WBC 8.4  HGB 11.2*  HCT 37.2*  MCV 96.9  PLT 427*    Cardiac Enzymes: No results for input(s): CKTOTAL, CKMB, CKMBINDEX, TROPONINI in the last 168 hours.  BNP (last 3 results) No results for input(s): BNP in the last 8760 hours.  ProBNP (last 3 results) No results for input(s): PROBNP in the last 8760 hours.  Radiological Exams: No  results found.  Assessment/Plan Active Problems:   Acute on chronic respiratory failure with hypoxia (HCC)   Chronic atrial fibrillation   Pneumonia of both lungs due to Pseudomonas species (HCC)   Pleural effusion, left   Muscular dystrophy (HCC)   1. Acute on chronic respiratory failure with hypoxia we will continue with T collar FiO2 35% PMV 2. Chronic atrial fibrillation rate controlled 3. Pneumonia treated we will continue to follow 4. Pleural effusion grossly unchanged 5. Muscular dystrophy at baseline   I have personally seen and evaluated the patient, evaluated laboratory and imaging results, formulated the assessment and plan and placed orders. The Patient requires high complexity decision making for assessment and support.  Case was discussed on Rounds with the Respiratory Therapy Staff  Yevonne Pax, MD [facility] Pulmonary Critical Care Medicine Sleep Medicine

## 2018-07-15 DIAGNOSIS — I482 Chronic atrial fibrillation, unspecified: Secondary | ICD-10-CM | POA: Diagnosis not present

## 2018-07-15 DIAGNOSIS — J9621 Acute and chronic respiratory failure with hypoxia: Secondary | ICD-10-CM | POA: Diagnosis not present

## 2018-07-15 DIAGNOSIS — J90 Pleural effusion, not elsewhere classified: Secondary | ICD-10-CM | POA: Diagnosis not present

## 2018-07-15 DIAGNOSIS — G7100 Muscular dystrophy, unspecified: Secondary | ICD-10-CM | POA: Diagnosis not present

## 2018-07-15 NOTE — Progress Notes (Signed)
Pulmonary Critical Care Medicine Luis Choi Luis Choi [place]   PULMONARY CRITICAL CARE SERVICE  PROGRESS NOTE  Date of Service: 07/15/2018  [name]  PRX:[id number]  DOB: 27-Nov-1965   DOA: 06/24/2018  Referring Physician: Carron Curie, MD  HPI: Luis Choi is a 53 y.o. male seen for follow up of Acute on Chronic Respiratory Failure.  Patient is on T collar right now is on 35% FiO2 good saturations are noted  Medications: Reviewed on Rounds  Physical Exam:  Vitals: Temperature 97.0 pulse 82 respiratory 21 blood pressure 90/65 saturations 93%  Ventilator Settings currently on T collar FiO2 35%  . General: Comfortable at this time . Eyes: Grossly normal lids, irises & conjunctiva . ENT: grossly tongue is normal . Neck: no obvious mass . Cardiovascular: S1 S2 normal no gallop . Respiratory: No rhonchi or rales are noted . Abdomen: soft . Skin: no rash seen on limited exam . Musculoskeletal: not rigid . Psychiatric:unable to assess . Neurologic: no seizure no involuntary movements         Lab Data:   Basic Metabolic Panel: Recent Labs  Lab 07/12/18 0555  NA 141  K 4.3  CL 102  CO2 30  GLUCOSE 117*  BUN 20  CREATININE 0.89  CALCIUM 9.5    ABG: No results for input(s): PHART, PCO2ART, PO2ART, HCO3, O2SAT in the last 168 hours.  Liver Function Tests: No results for input(s): AST, ALT, ALKPHOS, BILITOT, PROT, ALBUMIN in the last 168 hours. No results for input(s): LIPASE, AMYLASE in the last 168 hours. No results for input(s): AMMONIA in the last 168 hours.  CBC: Recent Labs  Lab 07/12/18 0555  WBC 8.4  HGB 11.2*  HCT 37.2*  MCV 96.9  PLT 427*    Cardiac Enzymes: No results for input(s): CKTOTAL, CKMB, CKMBINDEX, TROPONINI in the last 168 hours.  BNP (last 3 results) No results for input(s): BNP in the last 8760 hours.  ProBNP (last 3 results) No results for input(s): PROBNP in the last 8760 hours.  Radiological Exams: No results  found.  Assessment/Plan Active Problems:   Acute on chronic respiratory failure with hypoxia (HCC)   Chronic atrial fibrillation   Pneumonia of both lungs due to Pseudomonas species (HCC)   Pleural effusion, left   Muscular dystrophy (HCC)   1. Acute on chronic respiratory failure with hypoxia we will continue with T collar trials titrate oxygen continue pulmonary toilet 2. Chronic atrial fibrillation rate controlled at this time 3. Pneumonia treated clinically improved 4. Pleural effusion follow-up radiologically 5. Muscular dystrophy patient is at baseline   I have personally seen and evaluated the patient, evaluated laboratory and imaging results, formulated the assessment and plan and placed orders. The Patient requires high complexity decision making for assessment and support.  Case was discussed on Rounds with the Respiratory Therapy Staff  Luis Pax, MD [facility] Pulmonary Critical Care Medicine Sleep Medicine

## 2018-07-16 DIAGNOSIS — I482 Chronic atrial fibrillation, unspecified: Secondary | ICD-10-CM | POA: Diagnosis not present

## 2018-07-16 DIAGNOSIS — G7100 Muscular dystrophy, unspecified: Secondary | ICD-10-CM | POA: Diagnosis not present

## 2018-07-16 DIAGNOSIS — J9621 Acute and chronic respiratory failure with hypoxia: Secondary | ICD-10-CM | POA: Diagnosis not present

## 2018-07-16 DIAGNOSIS — J90 Pleural effusion, not elsewhere classified: Secondary | ICD-10-CM | POA: Diagnosis not present

## 2018-07-16 NOTE — Progress Notes (Addendum)
Pulmonary Critical Care Medicine [facility] [place]   PULMONARY CRITICAL CARE SERVICE  PROGRESS NOTE  Date of Service: 07/16/2018  Luis Choi  [id number]:[id number]  DOB: 16-Nov-1965   DOA: 06/24/2018  Referring Physician: Carron Curie, MD  HPI: Luis Choi is a 53 y.o. male seen for follow up of Acute on Chronic Respiratory Failure.  Patient continues to do well on T collar during the day with an FiO2 35%.  Continues to rest on ventilator at night  Medications: Reviewed on Rounds  Physical Exam:  Vitals: Pulse 83 respirations 12 BP 103/62 O2 sat 95% temp 98.7  Ventilator Settings on T collar during the day all night rest on ventilator mode AC PC rate of 12 IP 12 FiO2 35% PEEP of 5.  . General: Comfortable at this time . Eyes: Grossly normal lids, irises & conjunctiva . ENT: grossly tongue is normal . Neck: no obvious mass . Cardiovascular: S1 S2 normal no gallop . Respiratory: No rales or rhonchi noted . Abdomen: soft . Skin: no rash seen on limited exam . Musculoskeletal: not rigid . Psychiatric:unable to assess . Neurologic: no seizure no involuntary movements         Lab Data:   Basic Metabolic Panel: Recent Labs  Lab 07/12/18 0555  NA 141  K 4.3  CL 102  CO2 30  GLUCOSE 117*  BUN 20  CREATININE 0.89  CALCIUM 9.5    ABG: No results for input(s): PHART, PCO2ART, PO2ART, HCO3, O2SAT in the last 168 hours.  Liver Function Tests: No results for input(s): AST, ALT, ALKPHOS, BILITOT, PROT, ALBUMIN in the last 168 hours. No results for input(s): LIPASE, AMYLASE in the last 168 hours. No results for input(s): AMMONIA in the last 168 hours.  CBC: Recent Labs  Lab 07/12/18 0555  WBC 8.4  HGB 11.2*  HCT 37.2*  MCV 96.9  PLT 427*    Cardiac Enzymes: No results for input(s): CKTOTAL, CKMB, CKMBINDEX, TROPONINI in the last 168 hours.  BNP (last 3 results) No results for input(s): BNP in the last 8760 hours.  ProBNP (last 3 results) No  results for input(s): PROBNP in the last 8760 hours.  Radiological Exams: No results found.  Assessment/Plan Active Problems:   Acute on chronic respiratory failure with hypoxia (HCC)   Chronic atrial fibrillation   Pneumonia of both lungs due to Pseudomonas species (HCC)   Pleural effusion, left   Muscular dystrophy (HCC)   1. Acute on chronic respiratory failure with hypoxia continue with T collar trials and titrate oxygen as tolerated.  Continue aggressive pulmonary toilet and secretion management. 2. Chronic atrial fibrillation rate controlled at this time 3. Pneumonia treated clinically improved 4. Pleural effusion follow-up radiologically 5. Muscular dystrophy patient is at baseline   I have personally seen and evaluated the patient, evaluated laboratory and imaging results, formulated the assessment and plan and placed orders. The Patient requires high complexity decision making for assessment and support.  Case was discussed on Rounds with the Respiratory Therapy Staff  Yevonne Pax, MD [facility] Pulmonary Critical Care Medicine Sleep Medicine

## 2018-07-17 DIAGNOSIS — J9621 Acute and chronic respiratory failure with hypoxia: Secondary | ICD-10-CM | POA: Diagnosis not present

## 2018-07-17 DIAGNOSIS — G7100 Muscular dystrophy, unspecified: Secondary | ICD-10-CM | POA: Diagnosis not present

## 2018-07-17 DIAGNOSIS — J90 Pleural effusion, not elsewhere classified: Secondary | ICD-10-CM | POA: Diagnosis not present

## 2018-07-17 DIAGNOSIS — I482 Chronic atrial fibrillation, unspecified: Secondary | ICD-10-CM | POA: Diagnosis not present

## 2018-07-17 NOTE — Progress Notes (Addendum)
Pulmonary Critical Care Medicine [facility] [place]   PULMONARY CRITICAL CARE SERVICE  PROGRESS NOTE  Date of Service: 07/17/2018  [name]  XOV:[id number]  DOB: [birth year]   DOA: 06/24/2018  Referring Physician: Carron Curie, MD  HPI: Luis Choi is a 53 y.o. male seen for follow up of Acute on Chronic Respiratory Failure.  Patient is doing well with T collar during the day with FiO2 of 35%.  Patient rest on the ventilator at night without difficulty.  Good saturations are noted.  Medications: Reviewed on Rounds  Physical Exam:  Vitals: Pulse 80 respirations 16 BP 91/55 O2 sat 96% temp 97.5  Ventilator Settings T collar 35% and day with the vent mode AC PC rate of 12 IP 12 FiO2 35% PEEP of 5 at night  . General: Comfortable at this time . Eyes: Grossly normal lids, irises & conjunctiva . ENT: grossly tongue is normal . Neck: no obvious mass . Cardiovascular: S1 S2 normal no gallop . Respiratory: No rales or rhonchi noted . Abdomen: soft . Skin: no rash seen on limited exam . Musculoskeletal: not rigid . Psychiatric:unable to assess . Neurologic: no seizure no involuntary movements         Lab Data:   Basic Metabolic Panel: Recent Labs  Lab 07/12/18 0555  NA 141  K 4.3  CL 102  CO2 30  GLUCOSE 117*  BUN 20  CREATININE 0.89  CALCIUM 9.5    ABG: No results for input(s): PHART, PCO2ART, PO2ART, HCO3, O2SAT in the last 168 hours.  Liver Function Tests: No results for input(s): AST, ALT, ALKPHOS, BILITOT, PROT, ALBUMIN in the last 168 hours. No results for input(s): LIPASE, AMYLASE in the last 168 hours. No results for input(s): AMMONIA in the last 168 hours.  CBC: Recent Labs  Lab 07/12/18 0555  WBC 8.4  HGB 11.2*  HCT 37.2*  MCV 96.9  PLT 427*    Cardiac Enzymes: No results for input(s): CKTOTAL, CKMB, CKMBINDEX, TROPONINI in the last 168 hours.  BNP (last 3 results) No results for input(s): BNP in the last 8760  hours.  ProBNP (last 3 results) No results for input(s): PROBNP in the last 8760 hours.  Radiological Exams: No results found.  Assessment/Plan Active Problems:   Acute on chronic respiratory failure with hypoxia (HCC)   Chronic atrial fibrillation   Pneumonia of both lungs due to Pseudomonas species (HCC)   Pleural effusion, left   Muscular dystrophy (HCC)   1. Acute on chronic respiratory failure with hypoxia continue T collar trials and titrate oxygen as tolerated.  Continue secretion management aggressive pulmonary toilet. 2. Chronic atrial fibrillation rate controlled at this time 3. Pneumonia clinically improved 4. Pleural effusion follow-up radiologically 5. Muscular dystrophy patient is at baseline   I have personally seen and evaluated the patient, evaluated laboratory and imaging results, formulated the assessment and plan and placed orders. The Patient requires high complexity decision making for assessment and support.  Case was discussed on Rounds with the Respiratory Therapy Staff  Yevonne Pax, MD [facility] Pulmonary Critical Care Medicine Sleep Medicine

## 2018-07-18 DIAGNOSIS — J90 Pleural effusion, not elsewhere classified: Secondary | ICD-10-CM | POA: Diagnosis not present

## 2018-07-18 DIAGNOSIS — J9621 Acute and chronic respiratory failure with hypoxia: Secondary | ICD-10-CM | POA: Diagnosis not present

## 2018-07-18 DIAGNOSIS — I482 Chronic atrial fibrillation, unspecified: Secondary | ICD-10-CM | POA: Diagnosis not present

## 2018-07-18 DIAGNOSIS — G7100 Muscular dystrophy, unspecified: Secondary | ICD-10-CM | POA: Diagnosis not present

## 2018-07-18 NOTE — Progress Notes (Signed)
Pulmonary Critical Care Medicine [facility] [place]   PULMONARY CRITICAL CARE SERVICE  PROGRESS NOTE  Date of Service: 07/18/2018  SAYER DERBYSHIRE  KNL:[id number]  DOB: Sep 10, 1965   DOA: 06/24/2018  Referring Physician: Carron Curie, MD  HPI: LAMBERT BIRTS is a 53 y.o. male seen for follow up of Acute on Chronic Respiratory Failure.  Currently is on T collar no distress noted patient resting on the ventilator at nighttime  Medications: Reviewed on Rounds  Physical Exam:  Vitals: Temperature 97.3 pulse 65 respiratory 26 blood pressure 92/59 saturations 94%  Ventilator Settings currently is on T collar  . General: Comfortable at this time . Eyes: Grossly normal lids, irises & conjunctiva . ENT: grossly tongue is normal . Neck: no obvious mass . Cardiovascular: S1 S2 normal no gallop . Respiratory: No rhonchi or rales are noted . Abdomen: soft . Skin: no rash seen on limited exam . Musculoskeletal: not rigid . Psychiatric:unable to assess . Neurologic: no seizure no involuntary movements         Lab Data:   Basic Metabolic Panel: Recent Labs  Lab 07/12/18 0555  NA 141  K 4.3  CL 102  CO2 30  GLUCOSE 117*  BUN 20  CREATININE 0.89  CALCIUM 9.5    ABG: No results for input(s): PHART, PCO2ART, PO2ART, HCO3, O2SAT in the last 168 hours.  Liver Function Tests: No results for input(s): AST, ALT, ALKPHOS, BILITOT, PROT, ALBUMIN in the last 168 hours. No results for input(s): LIPASE, AMYLASE in the last 168 hours. No results for input(s): AMMONIA in the last 168 hours.  CBC: Recent Labs  Lab 07/12/18 0555  WBC 8.4  HGB 11.2*  HCT 37.2*  MCV 96.9  PLT 427*    Cardiac Enzymes: No results for input(s): CKTOTAL, CKMB, CKMBINDEX, TROPONINI in the last 168 hours.  BNP (last 3 results) No results for input(s): BNP in the last 8760 hours.  ProBNP (last 3 results) No results for input(s): PROBNP in the last 8760 hours.  Radiological Exams: No  results found.  Assessment/Plan Active Problems:   Acute on chronic respiratory failure with hypoxia (HCC)   Chronic atrial fibrillation   Pneumonia of both lungs due to Pseudomonas species (HCC)   Pleural effusion, left   Muscular dystrophy (HCC)   1. Acute on chronic respiratory failure with hypoxia doing well with T collar and ventilator at nighttime.  We will check continue to try to advance if he is able to tolerate however because of the muscular dystrophy this may be an issue 2. Chronic atrial fibrillation rate is controlled continue present management 3. Pneumonia treated clinically improved 4. Pleural effusion treated resolved 5. Muscular dystrophy advanced disease we will continue to monitor   I have personally seen and evaluated the patient, evaluated laboratory and imaging results, formulated the assessment and plan and placed orders. The Patient requires high complexity decision making for assessment and support.  Case was discussed on Rounds with the Respiratory Therapy Staff  Yevonne Pax, MD [facility] Pulmonary Critical Care Medicine Sleep Medicine

## 2018-07-19 DIAGNOSIS — J9621 Acute and chronic respiratory failure with hypoxia: Secondary | ICD-10-CM | POA: Diagnosis not present

## 2018-07-19 DIAGNOSIS — G7100 Muscular dystrophy, unspecified: Secondary | ICD-10-CM | POA: Diagnosis not present

## 2018-07-19 DIAGNOSIS — J90 Pleural effusion, not elsewhere classified: Secondary | ICD-10-CM | POA: Diagnosis not present

## 2018-07-19 DIAGNOSIS — I482 Chronic atrial fibrillation, unspecified: Secondary | ICD-10-CM | POA: Diagnosis not present

## 2018-07-19 NOTE — Progress Notes (Signed)
Pulmonary Critical Care Medicine [facility] [place]   PULMONARY CRITICAL CARE SERVICE  PROGRESS NOTE  Date of Service: 07/19/2018  Luis Choi  XTA:[id number]  DOB: 1965-11-30   DOA: 06/24/2018  Referring Physician: Carron Curie, MD  HPI: Luis Choi is a 53 y.o. male seen for follow up of Acute on Chronic Respiratory Failure.  Patient is on T collar currently on 35% FiO2 doing well  Medications: Reviewed on Rounds  Physical Exam:  Vitals: Temperature 97.4 pulse 85 respiratory 15 blood pressure 109/64 saturations 94%  Ventilator Settings patient is off the ventilator on T collar currently  . General: Comfortable at this time . Eyes: Grossly normal lids, irises & conjunctiva . ENT: grossly tongue is normal . Neck: no obvious mass . Cardiovascular: S1 S2 normal no gallop . Respiratory: No rhonchi or rales are noted at this time . Abdomen: soft . Skin: no rash seen on limited exam . Musculoskeletal: not rigid . Psychiatric:unable to assess . Neurologic: no seizure no involuntary movements         Lab Data:   Basic Metabolic Panel: No results for input(s): NA, K, CL, CO2, GLUCOSE, BUN, CREATININE, CALCIUM, MG, PHOS in the last 168 hours.  ABG: No results for input(s): PHART, PCO2ART, PO2ART, HCO3, O2SAT in the last 168 hours.  Liver Function Tests: No results for input(s): AST, ALT, ALKPHOS, BILITOT, PROT, ALBUMIN in the last 168 hours. No results for input(s): LIPASE, AMYLASE in the last 168 hours. No results for input(s): AMMONIA in the last 168 hours.  CBC: No results for input(s): WBC, NEUTROABS, HGB, HCT, MCV, PLT in the last 168 hours.  Cardiac Enzymes: No results for input(s): CKTOTAL, CKMB, CKMBINDEX, TROPONINI in the last 168 hours.  BNP (last 3 results) No results for input(s): BNP in the last 8760 hours.  ProBNP (last 3 results) No results for input(s): PROBNP in the last 8760 hours.  Radiological Exams: No results  found.  Assessment/Plan Active Problems:   Acute on chronic respiratory failure with hypoxia (HCC)   Chronic atrial fibrillation   Pneumonia of both lungs due to Pseudomonas species (HCC)   Pleural effusion, left   Muscular dystrophy (HCC)   1. Acute on chronic respiratory failure with hypoxia currently on T collar and 35% FiO2 we will titrate oxygen down as tolerated and advance to T collar as tolerated 2. Chronic atrial fibrillation rate controlled 3. Pneumonia treated 4. Pleural effusion continue to monitor 5. Muscular dystrophy patient is at baseline   I have personally seen and evaluated the patient, evaluated laboratory and imaging results, formulated the assessment and plan and placed orders. The Patient requires high complexity decision making for assessment and support.  Case was discussed on Rounds with the Respiratory Therapy Staff  Yevonne Pax, MD [facility] Pulmonary Critical Care Medicine Sleep Medicine

## 2018-07-20 DIAGNOSIS — I482 Chronic atrial fibrillation, unspecified: Secondary | ICD-10-CM | POA: Diagnosis not present

## 2018-07-20 DIAGNOSIS — J9621 Acute and chronic respiratory failure with hypoxia: Secondary | ICD-10-CM | POA: Diagnosis not present

## 2018-07-20 DIAGNOSIS — G7100 Muscular dystrophy, unspecified: Secondary | ICD-10-CM | POA: Diagnosis not present

## 2018-07-20 DIAGNOSIS — J90 Pleural effusion, not elsewhere classified: Secondary | ICD-10-CM | POA: Diagnosis not present

## 2018-07-20 NOTE — Progress Notes (Addendum)
Pulmonary Critical Care Medicine [facility] [place]   PULMONARY CRITICAL CARE SERVICE  PROGRESS NOTE  Date of Service: 07/20/2018  Luis Choi  GLO:[id number]  DOB: 10/10/1965   DOA: 06/24/2018  Referring Physician: Carron Curie, MD  HPI: Luis Choi is a 53 y.o. male seen for follow up of Acute on Chronic Respiratory Failure.  Patient has been on T collar 35% FiO2 for over 24 hours.  He is doing well at this acute distress noted.  Medications: Reviewed on Rounds  Physical Exam:  Vitals: Pulse 90 respirations 18 BP 110/68 O2 sat 95% temp 98.0  Ventilator Settings not currently on ventilator  . General: Comfortable at this time . Eyes: Grossly normal lids, irises & conjunctiva . ENT: grossly tongue is normal . Neck: no obvious mass . Cardiovascular: S1 S2 normal no gallop . Respiratory: No rales or rhonchi noted . Abdomen: soft . Skin: no rash seen on limited exam . Musculoskeletal: not rigid . Psychiatric:unable to assess . Neurologic: no seizure no involuntary movements         Lab Data:   Basic Metabolic Panel: No results for input(s): NA, K, CL, CO2, GLUCOSE, BUN, CREATININE, CALCIUM, MG, PHOS in the last 168 hours.  ABG: No results for input(s): PHART, PCO2ART, PO2ART, HCO3, O2SAT in the last 168 hours.  Liver Function Tests: No results for input(s): AST, ALT, ALKPHOS, BILITOT, PROT, ALBUMIN in the last 168 hours. No results for input(s): LIPASE, AMYLASE in the last 168 hours. No results for input(s): AMMONIA in the last 168 hours.  CBC: No results for input(s): WBC, NEUTROABS, HGB, HCT, MCV, PLT in the last 168 hours.  Cardiac Enzymes: No results for input(s): CKTOTAL, CKMB, CKMBINDEX, TROPONINI in the last 168 hours.  BNP (last 3 results) No results for input(s): BNP in the last 8760 hours.  ProBNP (last 3 results) No results for input(s): PROBNP in the last 8760 hours.  Radiological Exams: No results  found.  Assessment/Plan Active Problems:   Acute on chronic respiratory failure with hypoxia (HCC)   Chronic atrial fibrillation   Pneumonia of both lungs due to Pseudomonas species (HCC)   Pleural effusion, left   Muscular dystrophy (HCC)   1. Acute on chronic respiratory failure with hypoxia currently on T collar 35% FiO2 and doing well.  Continue to titrate oxygen as tolerated.  Continue aggressive pulmonary toilet and secretion management. 2. Chronic atrial fibrillation rate controlled 3. Pneumonia treated 4. Pleural effusion continue to monitor 5. Muscular dystrophy patient is at baseline   I have personally seen and evaluated the patient, evaluated laboratory and imaging results, formulated the assessment and plan and placed orders. The Patient requires high complexity decision making for assessment and support.  Case was discussed on Rounds with the Respiratory Therapy Staff  Yevonne Pax, MD [facility] Pulmonary Critical Care Medicine Sleep Medicine

## 2018-07-21 ENCOUNTER — Other Ambulatory Visit (HOSPITAL_COMMUNITY): Payer: Self-pay

## 2018-07-21 DIAGNOSIS — G7100 Muscular dystrophy, unspecified: Secondary | ICD-10-CM | POA: Diagnosis not present

## 2018-07-21 DIAGNOSIS — I482 Chronic atrial fibrillation, unspecified: Secondary | ICD-10-CM | POA: Diagnosis not present

## 2018-07-21 DIAGNOSIS — J90 Pleural effusion, not elsewhere classified: Secondary | ICD-10-CM | POA: Diagnosis not present

## 2018-07-21 DIAGNOSIS — J9621 Acute and chronic respiratory failure with hypoxia: Secondary | ICD-10-CM | POA: Diagnosis not present

## 2018-07-21 LAB — CBC
HCT: 36 % — ABNORMAL LOW (ref 39.0–52.0)
Hemoglobin: 10.4 g/dL — ABNORMAL LOW (ref 13.0–17.0)
MCH: 28.1 pg (ref 26.0–34.0)
MCHC: 28.9 g/dL — ABNORMAL LOW (ref 30.0–36.0)
MCV: 97.3 fL (ref 80.0–100.0)
Platelets: 363 10*3/uL (ref 150–400)
RBC: 3.7 MIL/uL — ABNORMAL LOW (ref 4.22–5.81)
RDW: 13.8 % (ref 11.5–15.5)
WBC: 8.1 10*3/uL (ref 4.0–10.5)
nRBC: 0 % (ref 0.0–0.2)

## 2018-07-21 LAB — BASIC METABOLIC PANEL
Anion gap: 9 (ref 5–15)
BUN: 20 mg/dL (ref 6–20)
CHLORIDE: 99 mmol/L (ref 98–111)
CO2: 33 mmol/L — ABNORMAL HIGH (ref 22–32)
Calcium: 9.3 mg/dL (ref 8.9–10.3)
Creatinine, Ser: 0.8 mg/dL (ref 0.61–1.24)
GFR calc Af Amer: >60 mL/min (ref >60)
GFR calc non Af Amer: >60 mL/min (ref >60)
Glucose, Bld: 116 mg/dL — ABNORMAL HIGH (ref 70–99)
Potassium: 4.5 mmol/L (ref 3.5–5.1)
SODIUM: 141 mmol/L (ref 135–145)

## 2018-07-21 NOTE — Progress Notes (Signed)
Pulmonary Critical Care Medicine [facility] [place]   PULMONARY CRITICAL CARE SERVICE  PROGRESS NOTE  Date of Service: 07/21/2018  Luis Choi  QPY:[id number]  DOB: Nov 05, 1965   DOA: 06/24/2018  Referring Physician: Carron Curie, MD  HPI: Luis Choi is a 53 y.o. male seen for follow up of Acute on Chronic Respiratory Failure.  Patient is currently on T collar has been off the ventilator for 48 hours currently is on 35% FiO2  Medications: Reviewed on Rounds  Physical Exam:  Vitals: Temperature 97.4 pulse 72 respiratory 15 blood pressure 106/52 saturations 97%  Ventilator Settings off ventilator on T collar right now  . General: Comfortable at this time . Eyes: Grossly normal lids, irises & conjunctiva . ENT: grossly tongue is normal . Neck: no obvious mass . Cardiovascular: S1 S2 normal no gallop . Respiratory: No rhonchi or rales are noted at this time . Abdomen: soft . Skin: no rash seen on limited exam . Musculoskeletal: not rigid . Psychiatric:unable to assess . Neurologic: no seizure no involuntary movements         Lab Data:   Basic Metabolic Panel: Recent Labs  Lab 07/21/18 0559  NA 141  K 4.5  CL 99  CO2 33*  GLUCOSE 116*  BUN 20  CREATININE 0.80  CALCIUM 9.3    ABG: No results for input(s): PHART, PCO2ART, PO2ART, HCO3, O2SAT in the last 168 hours.  Liver Function Tests: No results for input(s): AST, ALT, ALKPHOS, BILITOT, PROT, ALBUMIN in the last 168 hours. No results for input(s): LIPASE, AMYLASE in the last 168 hours. No results for input(s): AMMONIA in the last 168 hours.  CBC: Recent Labs  Lab 07/21/18 0559  WBC 8.1  HGB 10.4*  HCT 36.0*  MCV 97.3  PLT 363    Cardiac Enzymes: No results for input(s): CKTOTAL, CKMB, CKMBINDEX, TROPONINI in the last 168 hours.  BNP (last 3 results) No results for input(s): BNP in the last 8760 hours.  ProBNP (last 3 results) No results for input(s): PROBNP in the last 8760  hours.  Radiological Exams: Dg Chest Port 1 View  Result Date: 07/21/2018 CLINICAL DATA:  Respiratory failure. EXAM: PORTABLE CHEST 1 VIEW COMPARISON:  06/28/2018. FINDINGS: Tracheostomy tube in stable position. Stable cardiomegaly. Bibasilar atelectasis/infiltrates again noted. Interim improvement from prior exam. Tiny left pleural effusion again noted. Interim resolution of right pleural effusion. No pneumothorax. IMPRESSION: Bibasilar atelectasis/infiltrates again noted. Interim improvement from prior exam. Tiny left pleural effusion again noted. Interim resolution of right pleural effusion. Stable cardiomegaly. Electronically Signed   By: Maisie Fus  Register   On: 07/21/2018 06:41    Assessment/Plan Active Problems:   Acute on chronic respiratory failure with hypoxia (HCC)   Chronic atrial fibrillation   Pneumonia of both lungs due to Pseudomonas species (HCC)   Pleural effusion, left   Muscular dystrophy (HCC)   1. Acute on chronic respiratory failure with hypoxia we will continue with T collar trials continue secretion management pulmonary toilet. 2. Chronic atrial fibrillation rate controlled we will continue supportive care 3. Pneumonia treated 4. Pleural effusion diuretics as tolerated last film showed improvement of the effusion. 5. Muscular dystrophy patient is at baseline we will continue with supportive care   I have personally seen and evaluated the patient, evaluated laboratory and imaging results, formulated the assessment and plan and placed orders. The Patient requires high complexity decision making for assessment and support.  Case was discussed on Rounds with the Respiratory Therapy Staff  Allyne Gee, MD [facility] Pulmonary Critical Care Medicine Sleep Medicine

## 2018-07-22 DIAGNOSIS — I482 Chronic atrial fibrillation, unspecified: Secondary | ICD-10-CM | POA: Diagnosis not present

## 2018-07-22 DIAGNOSIS — J9621 Acute and chronic respiratory failure with hypoxia: Secondary | ICD-10-CM | POA: Diagnosis not present

## 2018-07-22 DIAGNOSIS — J90 Pleural effusion, not elsewhere classified: Secondary | ICD-10-CM | POA: Diagnosis not present

## 2018-07-22 DIAGNOSIS — G7100 Muscular dystrophy, unspecified: Secondary | ICD-10-CM | POA: Diagnosis not present

## 2018-07-22 NOTE — Progress Notes (Signed)
Pulmonary Critical Care Medicine [facility] [place]   PULMONARY CRITICAL CARE SERVICE  PROGRESS NOTE  Date of Service: 07/22/2018  Luis Choi  [id number]:[id number]  DOB: 06/24/1965   DOA: 06/24/2018  Referring Physician: Carron Curie, MD  HPI: Luis Choi is a 53 y.o. male seen for follow up of Acute on Chronic Respiratory Failure.  Currently patient is on T collar has been off the ventilator for more than 72 hours  Medications: Reviewed on Rounds  Physical Exam:  Vitals: Temperature 97.2 pulse 75 respiratory rate is 12 blood pressure 101/58 saturations 90%  Ventilator Settings off the ventilator on T collar currently  . General: Comfortable at this time . Eyes: Grossly normal lids, irises & conjunctiva . ENT: grossly tongue is normal . Neck: no obvious mass . Cardiovascular: S1 S2 normal no gallop . Respiratory: No rhonchi or rales are noted at this time . Abdomen: soft . Skin: no rash seen on limited exam . Musculoskeletal: not rigid . Psychiatric:unable to assess . Neurologic: no seizure no involuntary movements         Lab Data:   Basic Metabolic Panel: Recent Labs  Lab 07/21/18 0559  NA 141  K 4.5  CL 99  CO2 33*  GLUCOSE 116*  BUN 20  CREATININE 0.80  CALCIUM 9.3    ABG: No results for input(s): PHART, PCO2ART, PO2ART, HCO3, O2SAT in the last 168 hours.  Liver Function Tests: No results for input(s): AST, ALT, ALKPHOS, BILITOT, PROT, ALBUMIN in the last 168 hours. No results for input(s): LIPASE, AMYLASE in the last 168 hours. No results for input(s): AMMONIA in the last 168 hours.  CBC: Recent Labs  Lab 07/21/18 0559  WBC 8.1  HGB 10.4*  HCT 36.0*  MCV 97.3  PLT 363    Cardiac Enzymes: No results for input(s): CKTOTAL, CKMB, CKMBINDEX, TROPONINI in the last 168 hours.  BNP (last 3 results) No results for input(s): BNP in the last 8760 hours.  ProBNP (last 3 results) No results for input(s): PROBNP in the last 8760  hours.  Radiological Exams: Dg Chest Port 1 View  Result Date: 07/21/2018 CLINICAL DATA:  Respiratory failure. EXAM: PORTABLE CHEST 1 VIEW COMPARISON:  06/28/2018. FINDINGS: Tracheostomy tube in stable position. Stable cardiomegaly. Bibasilar atelectasis/infiltrates again noted. Interim improvement from prior exam. Tiny left pleural effusion again noted. Interim resolution of right pleural effusion. No pneumothorax. IMPRESSION: Bibasilar atelectasis/infiltrates again noted. Interim improvement from prior exam. Tiny left pleural effusion again noted. Interim resolution of right pleural effusion. Stable cardiomegaly. Electronically Signed   By: Maisie Fus  Register   On: 07/21/2018 06:41    Assessment/Plan Active Problems:   Acute on chronic respiratory failure with hypoxia (HCC)   Chronic atrial fibrillation   Pneumonia of both lungs due to Pseudomonas species (HCC)   Pleural effusion, left   Muscular dystrophy (HCC)   1. Acute on chronic respiratory failure with hypoxia continue with T collar trials which the patient has been tolerating well 2. Chronic atrial fibrillation rate controlled 3. Pneumonia treated 4. Pleural effusions follow radiologically 5. Muscular dystrophy at baseline   I have personally seen and evaluated the patient, evaluated laboratory and imaging results, formulated the assessment and plan and placed orders. The Patient requires high complexity decision making for assessment and support.  Case was discussed on Rounds with the Respiratory Therapy Staff  Yevonne Pax, MD [facility] Pulmonary Critical Care Medicine Sleep Medicine

## 2018-07-23 DIAGNOSIS — J9621 Acute and chronic respiratory failure with hypoxia: Secondary | ICD-10-CM | POA: Diagnosis not present

## 2018-07-23 DIAGNOSIS — I482 Chronic atrial fibrillation, unspecified: Secondary | ICD-10-CM | POA: Diagnosis not present

## 2018-07-23 DIAGNOSIS — G7100 Muscular dystrophy, unspecified: Secondary | ICD-10-CM | POA: Diagnosis not present

## 2018-07-23 DIAGNOSIS — J90 Pleural effusion, not elsewhere classified: Secondary | ICD-10-CM | POA: Diagnosis not present

## 2018-07-23 NOTE — Progress Notes (Signed)
Pulmonary Critical Care Medicine [facility] [place]   PULMONARY CRITICAL CARE SERVICE  PROGRESS NOTE  Date of Service: 07/23/2018  Luis Choi  XBJ:[id number]  DOB: [birth year]   DOA: 06/24/2018  Referring Physician: Carron Curie, MD  HPI: Luis Choi is a 53 y.o. male seen for follow up of Acute on Chronic Respiratory Failure.  Patient has been off the ventilator now for more than 72 hours  Medications: Reviewed on Rounds  Physical Exam:  Vitals: Temperature 97.0 pulse 69 respiratory rate 18 blood pressure 102/61 saturation 94%  Ventilator Settings off the ventilator on T collar currently  . General: Comfortable at this time . Eyes: Grossly normal lids, irises & conjunctiva . ENT: grossly tongue is normal . Neck: no obvious mass . Cardiovascular: S1 S2 normal no gallop . Respiratory: No rhonchi or rales are noted at this time . Abdomen: soft . Skin: no rash seen on limited exam . Musculoskeletal: not rigid . Psychiatric:unable to assess . Neurologic: no seizure no involuntary movements         Lab Data:   Basic Metabolic Panel: Recent Labs  Lab 07/21/18 0559  NA 141  K 4.5  CL 99  CO2 33*  GLUCOSE 116*  BUN 20  CREATININE 0.80  CALCIUM 9.3    ABG: No results for input(s): PHART, PCO2ART, PO2ART, HCO3, O2SAT in the last 168 hours.  Liver Function Tests: No results for input(s): AST, ALT, ALKPHOS, BILITOT, PROT, ALBUMIN in the last 168 hours. No results for input(s): LIPASE, AMYLASE in the last 168 hours. No results for input(s): AMMONIA in the last 168 hours.  CBC: Recent Labs  Lab 07/21/18 0559  WBC 8.1  HGB 10.4*  HCT 36.0*  MCV 97.3  PLT 363    Cardiac Enzymes: No results for input(s): CKTOTAL, CKMB, CKMBINDEX, TROPONINI in the last 168 hours.  BNP (last 3 results) No results for input(s): BNP in the last 8760 hours.  ProBNP (last 3 results) No results for input(s): PROBNP in the last 8760 hours.  Radiological  Exams: No results found.  Assessment/Plan Active Problems:   Acute on chronic respiratory failure with hypoxia (HCC)   Chronic atrial fibrillation   Pneumonia of both lungs due to Pseudomonas species (HCC)   Pleural effusion, left   Muscular dystrophy (HCC)   1. Acute on chronic respiratory failure with hypoxia doing well off the ventilator on room air continue continue with aggressive pulmonary toilet secretion management. 2. Chronic atrial fibrillation rate controlled 3. Pneumonia treated we will continue with supportive care 4. Pleural effusion resolved 5. Muscular dystrophy at baseline   I have personally seen and evaluated the patient, evaluated laboratory and imaging results, formulated the assessment and plan and placed orders. The Patient requires high complexity decision making for assessment and support.  Case was discussed on Rounds with the Respiratory Therapy Staff  Yevonne Pax, MD [facility] Pulmonary Critical Care Medicine Sleep Medicine

## 2018-07-24 DIAGNOSIS — I482 Chronic atrial fibrillation, unspecified: Secondary | ICD-10-CM | POA: Diagnosis not present

## 2018-07-24 DIAGNOSIS — G7100 Muscular dystrophy, unspecified: Secondary | ICD-10-CM | POA: Diagnosis not present

## 2018-07-24 DIAGNOSIS — J9621 Acute and chronic respiratory failure with hypoxia: Secondary | ICD-10-CM | POA: Diagnosis not present

## 2018-07-24 DIAGNOSIS — J90 Pleural effusion, not elsewhere classified: Secondary | ICD-10-CM | POA: Diagnosis not present

## 2018-07-24 NOTE — Progress Notes (Signed)
Pulmonary Critical Care Medicine [facility] [place]   PULMONARY CRITICAL CARE SERVICE  PROGRESS NOTE  Date of Service: 07/24/2018  Luis Choi  HQI:[id number]  DOB: [birth year]   DOA: 06/24/2018  Referring Physician: Carron Curie, MD  HPI: Luis Choi is a 53 y.o. male seen for follow up of Acute on Chronic Respiratory Failure.  Patient currently is on T collar has been on 35% FiO2 good saturations are noted  Medications: Reviewed on Rounds  Physical Exam:  Vitals: T 97.9 pulse 70 respiratory rate 16 blood pressure 106 1/63  Ventilator Settings currently is off the ventilator on T collar FiO2 35%  . General: Comfortable at this time . Eyes: Grossly normal lids, irises & conjunctiva . ENT: grossly tongue is normal . Neck: no obvious mass . Cardiovascular: S1 S2 normal no gallop . Respiratory: No rhonchi or rales are noted at this time . Abdomen: soft . Skin: no rash seen on limited exam . Musculoskeletal: not rigid . Psychiatric:unable to assess . Neurologic: no seizure no involuntary movements         Lab Data:   Basic Metabolic Panel: Recent Labs  Lab 07/21/18 0559  NA 141  K 4.5  CL 99  CO2 33*  GLUCOSE 116*  BUN 20  CREATININE 0.80  CALCIUM 9.3    ABG: No results for input(s): PHART, PCO2ART, PO2ART, HCO3, O2SAT in the last 168 hours.  Liver Function Tests: No results for input(s): AST, ALT, ALKPHOS, BILITOT, PROT, ALBUMIN in the last 168 hours. No results for input(s): LIPASE, AMYLASE in the last 168 hours. No results for input(s): AMMONIA in the last 168 hours.  CBC: Recent Labs  Lab 07/21/18 0559  WBC 8.1  HGB 10.4*  HCT 36.0*  MCV 97.3  PLT 363    Cardiac Enzymes: No results for input(s): CKTOTAL, CKMB, CKMBINDEX, TROPONINI in the last 168 hours.  BNP (last 3 results) No results for input(s): BNP in the last 8760 hours.  ProBNP (last 3 results) No results for input(s): PROBNP in the last 8760 hours.  Radiological  Exams: No results found.  Assessment/Plan Active Problems:   Acute on chronic respiratory failure with hypoxia (HCC)   Chronic atrial fibrillation   Pneumonia of both lungs due to Pseudomonas species (HCC)   Pleural effusion, left   Muscular dystrophy (HCC)   1. Acute on chronic respiratory failure with hypoxia continue with T collar he is doing better secretions are still significant needs frequent suctioning. 2. Chronic atrial fibrillation rate controlled 3. Bilateral pneumonia treated we will continue to follow 4. Pleural effusion status post thoracentesis 5. Muscular dystrophy at baseline we will continue to monitor   I have personally seen and evaluated the patient, evaluated laboratory and imaging results, formulated the assessment and plan and placed orders. The Patient requires high complexity decision making for assessment and support.  Case was discussed on Rounds with the Respiratory Therapy Staff  Yevonne Pax, MD [facility] Pulmonary Critical Care Medicine Sleep Medicine

## 2018-07-25 DIAGNOSIS — J90 Pleural effusion, not elsewhere classified: Secondary | ICD-10-CM | POA: Diagnosis not present

## 2018-07-25 DIAGNOSIS — I482 Chronic atrial fibrillation, unspecified: Secondary | ICD-10-CM | POA: Diagnosis not present

## 2018-07-25 DIAGNOSIS — G7100 Muscular dystrophy, unspecified: Secondary | ICD-10-CM | POA: Diagnosis not present

## 2018-07-25 DIAGNOSIS — J9621 Acute and chronic respiratory failure with hypoxia: Secondary | ICD-10-CM | POA: Diagnosis not present

## 2018-07-25 NOTE — Progress Notes (Addendum)
Pulmonary Critical Care Medicine [facility] [place]   PULMONARY CRITICAL CARE SERVICE  PROGRESS NOTE  Date of Service: 07/25/2018  Luis Choi  PQA:[id number]  DOB: [birth year]   DOA: 06/24/2018  Referring Physician: Carron Curie, MD  HPI: Luis Choi is a 53 y.o. male seen for follow up of Acute on Chronic Respiratory Failure.  Patient remains on T collar at this time requiring 35% FiO2.  He has good saturations and no acute dress at this time.  Medications: Reviewed on Rounds  Physical Exam:  Vitals: Pulse 73 respirations 24 BP 101/61 O2 sat 92% temp 98.3  Ventilator Settings patient not currently on ventilator  . General: Comfortable at this time . Eyes: Grossly normal lids, irises & conjunctiva . ENT: grossly tongue is normal . Neck: no obvious mass . Cardiovascular: S1 S2 normal no gallop . Respiratory: No rales or rhonchi noted . Abdomen: soft . Skin: no rash seen on limited exam . Musculoskeletal: not rigid . Psychiatric:unable to assess . Neurologic: no seizure no involuntary movements         Lab Data:   Basic Metabolic Panel: Recent Labs  Lab 07/21/18 0559  NA 141  K 4.5  CL 99  CO2 33*  GLUCOSE 116*  BUN 20  CREATININE 0.80  CALCIUM 9.3    ABG: No results for input(s): PHART, PCO2ART, PO2ART, HCO3, O2SAT in the last 168 hours.  Liver Function Tests: No results for input(s): AST, ALT, ALKPHOS, BILITOT, PROT, ALBUMIN in the last 168 hours. No results for input(s): LIPASE, AMYLASE in the last 168 hours. No results for input(s): AMMONIA in the last 168 hours.  CBC: Recent Labs  Lab 07/21/18 0559  WBC 8.1  HGB 10.4*  HCT 36.0*  MCV 97.3  PLT 363    Cardiac Enzymes: No results for input(s): CKTOTAL, CKMB, CKMBINDEX, TROPONINI in the last 168 hours.  BNP (last 3 results) No results for input(s): BNP in the last 8760 hours.  ProBNP (last 3 results) No results for input(s): PROBNP in the last 8760 hours.  Radiological  Exams: No results found.  Assessment/Plan Active Problems:   Acute on chronic respiratory failure with hypoxia (HCC)   Chronic atrial fibrillation   Pneumonia of both lungs due to Pseudomonas species (HCC)   Pleural effusion, left   Muscular dystrophy (HCC)   1. Acute on chronic respiratory failure hypoxia continue with T collar at 35% at this time.  Continue to wean oxygen as tolerated.  Continue secretion management and aggressive pulmonary toilet at this time.  Patient will continue to need frequent suctioning. 2. Chronic atrial fibrillation rate controlled 3. Bilateral pneumonia treated continue to follow 4. Pleural effusion status post thoracentesis 5. Muscular dystrophy at baseline continue to monitor   I have personally seen and evaluated the patient, evaluated laboratory and imaging results, formulated the assessment and plan and placed orders. The Patient requires high complexity decision making for assessment and support.  Case was discussed on Rounds with the Respiratory Therapy Staff  Yevonne Pax, MD [facility] Pulmonary Critical Care Medicine Sleep Medicine

## 2018-07-26 DIAGNOSIS — G7100 Muscular dystrophy, unspecified: Secondary | ICD-10-CM | POA: Diagnosis not present

## 2018-07-26 DIAGNOSIS — J9621 Acute and chronic respiratory failure with hypoxia: Secondary | ICD-10-CM | POA: Diagnosis not present

## 2018-07-26 DIAGNOSIS — J90 Pleural effusion, not elsewhere classified: Secondary | ICD-10-CM | POA: Diagnosis not present

## 2018-07-26 DIAGNOSIS — I482 Chronic atrial fibrillation, unspecified: Secondary | ICD-10-CM | POA: Diagnosis not present

## 2018-07-26 NOTE — Progress Notes (Addendum)
Pulmonary Critical Care Medicine [facility] [place]   PULMONARY CRITICAL CARE SERVICE  PROGRESS NOTE  Date of Service: 07/26/2018  MARKEL WACHHOLZ  HLK:[id number]  DOB: [birth year]   DOA: 06/24/2018  Referring Physician: Carron Curie, MD  HPI: Luis Choi is a 53 y.o. male seen for follow up of Acute on Chronic Respiratory Failure. Patient remains on T collar at this time requiring 35% FiO2.  He has good saturations and no acute dress at this time.  Medications: Reviewed on Rounds  Physical Exam:  Vitals: Pulse 76 respirations 15 BP 103/60 O2 sat 93% temp 97.2  Ventilator Settings patient's not currently on ventilator  . General: Comfortable at this time . Eyes: Grossly normal lids, irises & conjunctiva . ENT: grossly tongue is normal . Neck: no obvious mass . Cardiovascular: S1 S2 normal no gallop . Respiratory: No rales or rhonchi noted . Abdomen: soft . Skin: no rash seen on limited exam . Musculoskeletal: not rigid . Psychiatric:unable to assess . Neurologic: no seizure no involuntary movements         Lab Data:   Basic Metabolic Panel: Recent Labs  Lab 07/21/18 0559  NA 141  K 4.5  CL 99  CO2 33*  GLUCOSE 116*  BUN 20  CREATININE 0.80  CALCIUM 9.3    ABG: No results for input(s): PHART, PCO2ART, PO2ART, HCO3, O2SAT in the last 168 hours.  Liver Function Tests: No results for input(s): AST, ALT, ALKPHOS, BILITOT, PROT, ALBUMIN in the last 168 hours. No results for input(s): LIPASE, AMYLASE in the last 168 hours. No results for input(s): AMMONIA in the last 168 hours.  CBC: Recent Labs  Lab 07/21/18 0559  WBC 8.1  HGB 10.4*  HCT 36.0*  MCV 97.3  PLT 363    Cardiac Enzymes: No results for input(s): CKTOTAL, CKMB, CKMBINDEX, TROPONINI in the last 168 hours.  BNP (last 3 results) No results for input(s): BNP in the last 8760 hours.  ProBNP (last 3 results) No results for input(s): PROBNP in the last 8760 hours.  Radiological  Exams: No results found.  Assessment/Plan Active Problems:   Acute on chronic respiratory failure with hypoxia (HCC)   Chronic atrial fibrillation   Pneumonia of both lungs due to Pseudomonas species (HCC)   Pleural effusion, left   Muscular dystrophy (HCC)   1. Acute on chronic respiratory failure with hypoxia continue T collar at 35% FiO2.  Continue to wean oxygen as tolerated.  Continue aggressive pulmonary toilet and secretion management at this time. 2. Chron atrial fibrillation rate controlled 3. Bilateral you treated continue to follow 4. Pleural effusion status post thoracentesis 5. Muscle dystrophy at baseline continue to monitor   I have personally seen and evaluated the patient, evaluated laboratory and imaging results, formulated the assessment and plan and placed orders. The Patient requires high complexity decision making for assessment and support.  Case was discussed on Rounds with the Respiratory Therapy Staff  Yevonne Pax, MD [facility] Pulmonary Critical Care Medicine Sleep Medicine

## 2018-07-27 DIAGNOSIS — G7100 Muscular dystrophy, unspecified: Secondary | ICD-10-CM | POA: Diagnosis not present

## 2018-07-27 DIAGNOSIS — J9621 Acute and chronic respiratory failure with hypoxia: Secondary | ICD-10-CM | POA: Diagnosis not present

## 2018-07-27 DIAGNOSIS — J90 Pleural effusion, not elsewhere classified: Secondary | ICD-10-CM | POA: Diagnosis not present

## 2018-07-27 DIAGNOSIS — I482 Chronic atrial fibrillation, unspecified: Secondary | ICD-10-CM | POA: Diagnosis not present

## 2018-07-27 NOTE — Progress Notes (Addendum)
Pulmonary Critical Care Medicine [facility] [place]   PULMONARY CRITICAL CARE SERVICE  PROGRESS NOTE  Date of Service: 07/27/2018  Luis Choi  [id number]:[id number]  DOB: 17-Aug-1965   DOA: 06/24/2018  Referring Physician: Carron Curie, MD  HPI: Luis Choi is a 53 y.o. male seen for follow up of Acute on Chronic Respiratory Failure.  Patient remains on T collar 35% FiO2.  He is using his PMV well with no difficulty at this time.  Medications: Reviewed on Rounds  Physical Exam:  Vitals: Pulse 81 respirations 20 BP 105/63 O2 sat 95% temp 97.5  Ventilator Settings not currently on ventilator  . General: Comfortable at this time . Eyes: Grossly normal lids, irises & conjunctiva . ENT: grossly tongue is normal . Neck: no obvious mass . Cardiovascular: S1 S2 normal no gallop . Respiratory: No rales or rhonchi noted . Abdomen: soft . Skin: no rash seen on limited exam . Musculoskeletal: not rigid . Psychiatric:unable to assess . Neurologic: no seizure no involuntary movements         Lab Data:   Basic Metabolic Panel: Recent Labs  Lab 07/21/18 0559  NA 141  K 4.5  CL 99  CO2 33*  GLUCOSE 116*  BUN 20  CREATININE 0.80  CALCIUM 9.3    ABG: No results for input(s): PHART, PCO2ART, PO2ART, HCO3, O2SAT in the last 168 hours.  Liver Function Tests: No results for input(s): AST, ALT, ALKPHOS, BILITOT, PROT, ALBUMIN in the last 168 hours. No results for input(s): LIPASE, AMYLASE in the last 168 hours. No results for input(s): AMMONIA in the last 168 hours.  CBC: Recent Labs  Lab 07/21/18 0559  WBC 8.1  HGB 10.4*  HCT 36.0*  MCV 97.3  PLT 363    Cardiac Enzymes: No results for input(s): CKTOTAL, CKMB, CKMBINDEX, TROPONINI in the last 168 hours.  BNP (last 3 results) No results for input(s): BNP in the last 8760 hours.  ProBNP (last 3 results) No results for input(s): PROBNP in the last 8760 hours.  Radiological Exams: No results  found.  Assessment/Plan Active Problems:   Acute on chronic respiratory failure with hypoxia (HCC)   Chronic atrial fibrillation   Pneumonia of both lungs due to Pseudomonas species (HCC)   Pleural effusion, left   Muscular dystrophy (HCC)   1. Acute on chronic respiratory failure with hypoxia continue with T collar 35% FiO2.  Continue to wean oxygen as patient can tolerate.  Also continue aggressive pulmonary toilet and secretion management as well as supportive measures. 2. Chronic atrial fibrillation rate controlled 3. Bilateral pneumonia treated continue to follow 4. Pleural effusion status post thoracentesis 5. Muscular dystrophy at baseline continue to monitor   I have personally seen and evaluated the patient, evaluated laboratory and imaging results, formulated the assessment and plan and placed orders. The Patient requires high complexity decision making for assessment and support.  Case was discussed on Rounds with the Respiratory Therapy Staff  Yevonne Pax, MD [facility] Pulmonary Critical Care Medicine Sleep Medicine

## 2018-07-28 ENCOUNTER — Other Ambulatory Visit (HOSPITAL_COMMUNITY): Payer: Self-pay

## 2018-07-28 DIAGNOSIS — J90 Pleural effusion, not elsewhere classified: Secondary | ICD-10-CM | POA: Diagnosis not present

## 2018-07-28 DIAGNOSIS — J9621 Acute and chronic respiratory failure with hypoxia: Secondary | ICD-10-CM | POA: Diagnosis not present

## 2018-07-28 DIAGNOSIS — I482 Chronic atrial fibrillation, unspecified: Secondary | ICD-10-CM | POA: Diagnosis not present

## 2018-07-28 DIAGNOSIS — G7100 Muscular dystrophy, unspecified: Secondary | ICD-10-CM | POA: Diagnosis not present

## 2018-07-28 NOTE — Progress Notes (Addendum)
Pulmonary Critical Care Medicine [facility] [place]   PULMONARY CRITICAL CARE SERVICE  PROGRESS NOTE  Date of Service: 07/28/2018  MEHKI CEFARATTI  EZB:[id number]  DOB: 02/22/1966   DOA: 06/24/2018  Referring Physician: Carron Curie, MD  HPI: Luis Choi is a 53 y.o. male seen for follow up of Acute on Chronic Respiratory Failure.  Patient mains on T collar 35% FiO2.  Using PMV without difficulty.  Patient's trach was also changed to #6 cuffless today.  No distress noted at this time.  Medications: Reviewed on Rounds  Physical Exam:  Vitals: Pulse 77 respirations 16 BP 108/65 O2 sat 4% temp 96.9  Ventilator Settings not currently on ventilator  . General: Comfortable at this time . Eyes: Grossly normal lids, irises & conjunctiva . ENT: grossly tongue is normal . Neck: no obvious mass . Cardiovascular: S1 S2 normal no gallop . Respiratory: No rales or rhonchi noted . Abdomen: soft . Skin: no rash seen on limited exam . Musculoskeletal: not rigid . Psychiatric:unable to assess . Neurologic: no seizure no involuntary movements         Lab Data:   Basic Metabolic Panel: No results for input(s): NA, K, CL, CO2, GLUCOSE, BUN, CREATININE, CALCIUM, MG, PHOS in the last 168 hours.  ABG: No results for input(s): PHART, PCO2ART, PO2ART, HCO3, O2SAT in the last 168 hours.  Liver Function Tests: No results for input(s): AST, ALT, ALKPHOS, BILITOT, PROT, ALBUMIN in the last 168 hours. No results for input(s): LIPASE, AMYLASE in the last 168 hours. No results for input(s): AMMONIA in the last 168 hours.  CBC: No results for input(s): WBC, NEUTROABS, HGB, HCT, MCV, PLT in the last 168 hours.  Cardiac Enzymes: No results for input(s): CKTOTAL, CKMB, CKMBINDEX, TROPONINI in the last 168 hours.  BNP (last 3 results) No results for input(s): BNP in the last 8760 hours.  ProBNP (last 3 results) No results for input(s): PROBNP in the last 8760 hours.  Radiological  Exams: No results found.  Assessment/Plan Active Problems:   Acute on chronic respiratory failure with hypoxia (HCC)   Chronic atrial fibrillation   Pneumonia of both lungs due to Pseudomonas species (HCC)   Pleural effusion, left   Muscular dystrophy (HCC)   1. Acute on chronic respiratory failure with hypoxia continue with T collar 35% FiO2 and wean as tolerated.  Continue aggressive [place] toilet and secretion management as well supportive measures. 2. Chronic atrial fibrillation rate controlled 3. Bilateral pneumonia treated continue to follow 4. Pleural effusion status post thoracentesis 5. Muscular dystrophy at baseline continue monitor   I have personally seen and evaluated the patient, evaluated laboratory and imaging results, formulated the assessment and plan and placed orders. The Patient requires high complexity decision making for assessment and support.  Case was discussed on Rounds with the Respiratory Therapy Staff  Yevonne Pax, MD [facility] Pulmonary Critical Care Medicine Sleep Medicine

## 2018-07-29 DIAGNOSIS — G7100 Muscular dystrophy, unspecified: Secondary | ICD-10-CM | POA: Diagnosis not present

## 2018-07-29 DIAGNOSIS — I482 Chronic atrial fibrillation, unspecified: Secondary | ICD-10-CM | POA: Diagnosis not present

## 2018-07-29 DIAGNOSIS — J90 Pleural effusion, not elsewhere classified: Secondary | ICD-10-CM | POA: Diagnosis not present

## 2018-07-29 DIAGNOSIS — J9621 Acute and chronic respiratory failure with hypoxia: Secondary | ICD-10-CM | POA: Diagnosis not present

## 2018-07-29 NOTE — Progress Notes (Addendum)
Pulmonary Critical Care Medicine [facility] [place]   PULMONARY CRITICAL CARE SERVICE  PROGRESS NOTE  Date of Service: 07/29/2018  Luis Choi  [id number]  DOB: 1965/05/08   DOA: 06/24/2018  Referring Physician: Carron Curie, MD  HPI: Luis Choi is a 53 y.o. male seen for follow up of Acute on Chronic Respiratory Failure.  Patient remains on aerosol trach collar 35% FiO2.  He is using PMV well with acute distress noted at this time.  Medications: Reviewed on Rounds  Physical Exam:  Vitals: Pulse 71 respirations 16 BP 106/67 O2 sat 95% temp 98.9  Ventilator Settings currently on ventilator  . General: Comfortable at this time . Eyes: Grossly normal lids, irises & conjunctiva . ENT: grossly tongue is normal . Neck: no obvious mass . Cardiovascular: S1 S2 normal no gallop . Respiratory: No rales or rhonchi noted . Abdomen: soft . Skin: no rash seen on limited exam . Musculoskeletal: not rigid . Psychiatric:unable to assess . Neurologic: no seizure no involuntary movements         Lab Data:   Basic Metabolic Panel: No results for input(s): NA, K, CL, CO2, GLUCOSE, BUN, CREATININE, CALCIUM, MG, PHOS in the last 168 hours.  ABG: No results for input(s): PHART, PCO2ART, PO2ART, HCO3, O2SAT in the last 168 hours.  Liver Function Tests: No results for input(s): AST, ALT, ALKPHOS, BILITOT, PROT, ALBUMIN in the last 168 hours. No results for input(s): LIPASE, AMYLASE in the last 168 hours. No results for input(s): AMMONIA in the last 168 hours.  CBC: No results for input(s): WBC, NEUTROABS, HGB, HCT, MCV, PLT in the last 168 hours.  Cardiac Enzymes: No results for input(s): CKTOTAL, CKMB, CKMBINDEX, TROPONINI in the last 168 hours.  BNP (last 3 results) No results for input(s): BNP in the last 8760 hours.  ProBNP (last 3 results) No results for input(s): PROBNP in the last 8760 hours.  Radiological Exams: No results  found.  Assessment/Plan Active Problems:   Acute on chronic respiratory failure with hypoxia (HCC)   Chronic atrial fibrillation   Pneumonia of both lungs due to Pseudomonas species (HCC)   Pleural effusion, left   Muscular dystrophy (HCC)   1. Acute on chronic respiratory failure with hypoxia continue with T collar 35% FiO2 and wean oxygen as tolerated.  Continue aggressive pulmonary toilet and secretion management at this time. 2. Chronic atrial fibrillation rate controlled 3. Bilateral pneumonia treated continue to follow 4. Pleural effusion status post thoracentesis 5. Muscular dystrophy and pain continue to monitor   I have personally seen and evaluated the patient, evaluated laboratory and imaging results, formulated the assessment and plan and placed orders. The Patient requires high complexity decision making for assessment and support.  Case was discussed on Rounds with the Respiratory Therapy Staff  Yevonne Pax, MD [facility] Pulmonary Critical Care Medicine Sleep Medicine

## 2018-07-30 DIAGNOSIS — J9621 Acute and chronic respiratory failure with hypoxia: Secondary | ICD-10-CM | POA: Diagnosis not present

## 2018-07-30 DIAGNOSIS — J90 Pleural effusion, not elsewhere classified: Secondary | ICD-10-CM | POA: Diagnosis not present

## 2018-07-30 DIAGNOSIS — I482 Chronic atrial fibrillation, unspecified: Secondary | ICD-10-CM | POA: Diagnosis not present

## 2018-07-30 DIAGNOSIS — G7100 Muscular dystrophy, unspecified: Secondary | ICD-10-CM | POA: Diagnosis not present

## 2018-07-30 NOTE — Progress Notes (Addendum)
Pulmonary Critical Care Medicine [facility] [place]   PULMONARY CRITICAL CARE SERVICE  PROGRESS NOTE  Date of Service: 07/30/2018  Luis Choi  [id number]  DOB: 1965/10/02   DOA: 06/24/2018  Referring Physician: Carron Curie, MD  HPI: Luis Choi is a 53 y.o. male seen for follow up of Acute on Chronic Respiratory Failure.  Patient continues on aerosol trach collar 35% FiO2 using PMV with no distress noted at this time.  Medications: Reviewed on Rounds  Physical Exam:  Vitals: Pulse 80 respirations 14 BP one 2/69 O2 sat 91% temp 98.2  Ventilator Settings currently not on ventilator  . General: Comfortable at this time . Eyes: Grossly normal lids, irises & conjunctiva . ENT: grossly tongue is normal . Neck: no obvious mass . Cardiovascular: S1 S2 normal no gallop . Respiratory: No rales or rhonchi noted . Abdomen: soft . Skin: no rash seen on limited exam . Musculoskeletal: not rigid . Psychiatric:unable to assess . Neurologic: no seizure no involuntary movements         Lab Data:   Basic Metabolic Panel: No results for input(s): NA, K, CL, CO2, GLUCOSE, BUN, CREATININE, CALCIUM, MG, PHOS in the last 168 hours.  ABG: No results for input(s): PHART, PCO2ART, PO2ART, HCO3, O2SAT in the last 168 hours.  Liver Function Tests: No results for input(s): AST, ALT, ALKPHOS, BILITOT, PROT, ALBUMIN in the last 168 hours. No results for input(s): LIPASE, AMYLASE in the last 168 hours. No results for input(s): AMMONIA in the last 168 hours.  CBC: No results for input(s): WBC, NEUTROABS, HGB, HCT, MCV, PLT in the last 168 hours.  Cardiac Enzymes: No results for input(s): CKTOTAL, CKMB, CKMBINDEX, TROPONINI in the last 168 hours.  BNP (last 3 results) No results for input(s): BNP in the last 8760 hours.  ProBNP (last 3 results) No results for input(s): PROBNP in the last 8760 hours.  Radiological Exams: No results found.  Assessment/Plan Active  Problems:   Acute on chronic respiratory failure with hypoxia (HCC)   Chronic atrial fibrillation   Pneumonia of both lungs due to Pseudomonas species (HCC)   Pleural effusion, left   Muscular dystrophy (HCC)   1. Acute on chronic respiratory failure with hypoxia continue with T collar 35% FiO2 and wean oxygen as tolerated.  Continue pulmonary toilet and secretion management this time 2. Chronic atrial fibrillation rate controlled 3. Bilateral pneumonia treated continue to follow 4. Pleural effusion status post thoracentesis 5. Muscular dystrophy at baseline continue to follow   I have personally seen and evaluated the patient, evaluated laboratory and imaging results, formulated the assessment and plan and placed orders. The Patient requires high complexity decision making for assessment and support.  Case was discussed on Rounds with the Respiratory Therapy Staff  Yevonne Pax, MD [facility] Pulmonary Critical Care Medicine Sleep Medicine

## 2018-07-31 DIAGNOSIS — J90 Pleural effusion, not elsewhere classified: Secondary | ICD-10-CM | POA: Diagnosis not present

## 2018-07-31 DIAGNOSIS — J9621 Acute and chronic respiratory failure with hypoxia: Secondary | ICD-10-CM | POA: Diagnosis not present

## 2018-07-31 DIAGNOSIS — I482 Chronic atrial fibrillation, unspecified: Secondary | ICD-10-CM | POA: Diagnosis not present

## 2018-07-31 DIAGNOSIS — G7100 Muscular dystrophy, unspecified: Secondary | ICD-10-CM | POA: Diagnosis not present

## 2018-07-31 LAB — CBC
HCT: 35.4 % — ABNORMAL LOW (ref 39.0–52.0)
Hemoglobin: 10.7 g/dL — ABNORMAL LOW (ref 13.0–17.0)
MCH: 29.2 pg (ref 26.0–34.0)
MCHC: 30.2 g/dL (ref 30.0–36.0)
MCV: 96.5 fL (ref 80.0–100.0)
Platelets: 327 10*3/uL (ref 150–400)
RBC: 3.67 MIL/uL — ABNORMAL LOW (ref 4.22–5.81)
RDW: 14.3 % (ref 11.5–15.5)
WBC: 8.7 10*3/uL (ref 4.0–10.5)
nRBC: 0 % (ref 0.0–0.2)

## 2018-07-31 LAB — BASIC METABOLIC PANEL
Anion gap: 7 (ref 5–15)
BUN: 12 mg/dL (ref 6–20)
CHLORIDE: 105 mmol/L (ref 98–111)
CO2: 30 mmol/L (ref 22–32)
Calcium: 9.3 mg/dL (ref 8.9–10.3)
Creatinine, Ser: 0.56 mg/dL — ABNORMAL LOW (ref 0.61–1.24)
GFR calc Af Amer: >60 mL/min (ref >60)
GFR calc non Af Amer: >60 mL/min (ref >60)
Glucose, Bld: 141 mg/dL — ABNORMAL HIGH (ref 70–99)
Potassium: 4.5 mmol/L (ref 3.5–5.1)
Sodium: 142 mmol/L (ref 135–145)

## 2018-07-31 NOTE — Progress Notes (Addendum)
Pulmonary Critical Care Medicine [facility] [place]   PULMONARY CRITICAL CARE SERVICE  PROGRESS NOTE  Date of Service: 07/31/2018  Luis Choi  [id number]  DOB: [birth year]   DOA: 06/24/2018  Referring Physician: Carron Curie, MD  HPI: Luis Choi is a 53 y.o. male seen for follow up of Acute on Chronic Respiratory Failure.  Patient remains on aerosol trach collar 35% FiO2.  Doing well at this time with minimal secretions noted.  Medications: Reviewed on Rounds  Physical Exam:  Vitals: Pulse 79 respirations 15 BP 101/65 O2 sat 91% temp 97.6  Ventilator Settings not currently on ventilator  . General: Comfortable at this time . Eyes: Grossly normal lids, irises & conjunctiva . ENT: grossly tongue is normal . Neck: no obvious mass . Cardiovascular: S1 S2 normal no gallop . Respiratory: No rales or rhonchi noted . Abdomen: soft . Skin: no rash seen on limited exam . Musculoskeletal: not rigid . Psychiatric:unable to assess . Neurologic: no seizure no involuntary movements         Lab Data:   Basic Metabolic Panel: Recent Labs  Lab 07/31/18 0353  NA 142  K 4.5  CL 105  CO2 30  GLUCOSE 141*  BUN 12  CREATININE 0.56*  CALCIUM 9.3    ABG: No results for input(s): PHART, PCO2ART, PO2ART, HCO3, O2SAT in the last 168 hours.  Liver Function Tests: No results for input(s): AST, ALT, ALKPHOS, BILITOT, PROT, ALBUMIN in the last 168 hours. No results for input(s): LIPASE, AMYLASE in the last 168 hours. No results for input(s): AMMONIA in the last 168 hours.  CBC: Recent Labs  Lab 07/31/18 0353  WBC 8.7  HGB 10.7*  HCT 35.4*  MCV 96.5  PLT 327    Cardiac Enzymes: No results for input(s): CKTOTAL, CKMB, CKMBINDEX, TROPONINI in the last 168 hours.  BNP (last 3 results) No results for input(s): BNP in the last 8760 hours.  ProBNP (last 3 results) No results for input(s): PROBNP in the last 8760 hours.  Radiological Exams: No results  found.  Assessment/Plan Active Problems:   Acute on chronic respiratory failure with hypoxia (HCC)   Chronic atrial fibrillation   Pneumonia of both lungs due to Pseudomonas species (HCC)   Pleural effusion, left   Muscular dystrophy (HCC)   1. Acute on chronic respiratory failure with hypoxia continue with T collar 35% FiO2 and continue to wean oxygen as patient can tolerate.  Continue pulmonary toilet and secretion management also. 2. Chronic atrial fibrillation rate controlled 3. Bilateral pneumonia treated continue to follow 4. Pleural effusion status post thoracentesis 5. Muscular dystrophy at baseline continue to follow   I have personally seen and evaluated the patient, evaluated laboratory and imaging results, formulated the assessment and plan and placed orders. The Patient requires high complexity decision making for assessment and support.  Case was discussed on Rounds with the Respiratory Therapy Staff  Yevonne Pax, MD [facility] Pulmonary Critical Care Medicine Sleep Medicine

## 2018-08-01 DIAGNOSIS — I482 Chronic atrial fibrillation, unspecified: Secondary | ICD-10-CM | POA: Diagnosis not present

## 2018-08-01 DIAGNOSIS — G7100 Muscular dystrophy, unspecified: Secondary | ICD-10-CM | POA: Diagnosis not present

## 2018-08-01 DIAGNOSIS — J90 Pleural effusion, not elsewhere classified: Secondary | ICD-10-CM | POA: Diagnosis not present

## 2018-08-01 DIAGNOSIS — J9621 Acute and chronic respiratory failure with hypoxia: Secondary | ICD-10-CM | POA: Diagnosis not present

## 2018-08-01 NOTE — Progress Notes (Addendum)
Pulmonary Critical Care Medicine [facility] [place]   PULMONARY CRITICAL CARE SERVICE  PROGRESS NOTE  Date of Service: 08/01/2018  Luis Choi  MGN:[id number]  DOB: 1965-07-05   DOA: 06/24/2018  Referring Physician: Carron Curie, MD  HPI: Luis Choi is a 53 y.o. male seen for follow up of Acute on Chronic Respiratory Failure.  Patient remains on aerosol trach collar 28% FiO2.  Using PMV without difficulty.  Medications: Reviewed on Rounds  Physical Exam:  Vitals: Pulse 86 respirations 24 BP 125/69 O2 sat 90% temp 98.9  Ventilator Settings not currently on ventilator  . General: Comfortable at this time . Eyes: Grossly normal lids, irises & conjunctiva . ENT: grossly tongue is normal . Neck: no obvious mass . Cardiovascular: S1 S2 normal no gallop . Respiratory: No rales or rhonchi noted . Abdomen: soft . Skin: no rash seen on limited exam . Musculoskeletal: not rigid . Psychiatric:unable to assess . Neurologic: no seizure no involuntary movements         Lab Data:   Basic Metabolic Panel: Recent Labs  Lab 07/31/18 0353  NA 142  K 4.5  CL 105  CO2 30  GLUCOSE 141*  BUN 12  CREATININE 0.56*  CALCIUM 9.3    ABG: No results for input(s): PHART, PCO2ART, PO2ART, HCO3, O2SAT in the last 168 hours.  Liver Function Tests: No results for input(s): AST, ALT, ALKPHOS, BILITOT, PROT, ALBUMIN in the last 168 hours. No results for input(s): LIPASE, AMYLASE in the last 168 hours. No results for input(s): AMMONIA in the last 168 hours.  CBC: Recent Labs  Lab 07/31/18 0353  WBC 8.7  HGB 10.7*  HCT 35.4*  MCV 96.5  PLT 327    Cardiac Enzymes: No results for input(s): CKTOTAL, CKMB, CKMBINDEX, TROPONINI in the last 168 hours.  BNP (last 3 results) No results for input(s): BNP in the last 8760 hours.  ProBNP (last 3 results) No results for input(s): PROBNP in the last 8760 hours.  Radiological Exams: No results  found.  Assessment/Plan Active Problems:   Acute on chronic respiratory failure with hypoxia (HCC)   Chronic atrial fibrillation   Pneumonia of both lungs due to Pseudomonas species (HCC)   Pleural effusion, left   Muscular dystrophy (HCC)   1. Acute on chronic respiratory failure with hypoxia continue with T collar 35% FiO2 as well as weaning as patient can tolerate.  Continue pulmonary toilet and secretion management 2. Chronic atrial fibrillation rate controlled 3. Bilateral pneumonia treated continue to follow 4. Pleural effusion status post thoracentesis 5. Muscular dystrophy at baseline continue to follow   I have personally seen and evaluated the patient, evaluated laboratory and imaging results, formulated the assessment and plan and placed orders. The Patient requires high complexity decision making for assessment and support.  Case was discussed on Rounds with the Respiratory Therapy Staff  Yevonne Pax, MD [facility] Pulmonary Critical Care Medicine Sleep Medicine

## 2018-08-02 DIAGNOSIS — I482 Chronic atrial fibrillation, unspecified: Secondary | ICD-10-CM | POA: Diagnosis not present

## 2018-08-02 DIAGNOSIS — G7100 Muscular dystrophy, unspecified: Secondary | ICD-10-CM | POA: Diagnosis not present

## 2018-08-02 DIAGNOSIS — J9621 Acute and chronic respiratory failure with hypoxia: Secondary | ICD-10-CM | POA: Diagnosis not present

## 2018-08-02 DIAGNOSIS — J90 Pleural effusion, not elsewhere classified: Secondary | ICD-10-CM | POA: Diagnosis not present

## 2018-08-02 NOTE — Progress Notes (Addendum)
Pulmonary Critical Care Medicine [facility] [place]   PULMONARY CRITICAL CARE SERVICE  PROGRESS NOTE  Date of Service: 08/02/2018  Luis Choi  [id number]  DOB: [birth year]   DOA: 06/24/2018  Referring Physician: Carron Curie, MD  HPI: Luis Choi is a 53 y.o. male seen for follow up of Acute on Chronic Respiratory Failure.  Patient continues on aerosol trach collar 28% FiO2.  Using PMV without difficulty.  Respiratory therapy reports that the patient does desat at night and nighttime staff turns aerosol trach collar FiO2 up to 40%.  He typically is back down during the day with no difficulty.  Medications: Reviewed on Rounds  Physical Exam:  Vitals: 53 respirations 20 BP 122/79 O2 sat 94% temp 7.8  Ventilator Settings not currently on ventilator  . General: Comfortable at this time . Eyes: Grossly normal lids, irises & conjunctiva . ENT: grossly tongue is normal . Neck: no obvious mass . Cardiovascular: S1 S2 normal no gallop . Respiratory: No rales or rhonchi noted . Abdomen: soft . Skin: no rash seen on limited exam . Musculoskeletal: not rigid . Psychiatric:unable to assess . Neurologic: no seizure no involuntary movements         Lab Data:   Basic Metabolic Panel: Recent Labs  Lab 07/31/18 0353  NA 142  K 4.5  CL 105  CO2 30  GLUCOSE 141*  BUN 12  CREATININE 0.56*  CALCIUM 9.3    ABG: No results for input(s): PHART, PCO2ART, PO2ART, HCO3, O2SAT in the last 168 hours.  Liver Function Tests: No results for input(s): AST, ALT, ALKPHOS, BILITOT, PROT, ALBUMIN in the last 168 hours. No results for input(s): LIPASE, AMYLASE in the last 168 hours. No results for input(s): AMMONIA in the last 168 hours.  CBC: Recent Labs  Lab 07/31/18 0353  WBC 8.7  HGB 10.7*  HCT 35.4*  MCV 96.5  PLT 327    Cardiac Enzymes: No results for input(s): CKTOTAL, CKMB, CKMBINDEX, TROPONINI in the last 168 hours.  BNP (last 3 results) No results  for input(s): BNP in the last 8760 hours.  ProBNP (last 3 results) No results for input(s): PROBNP in the last 8760 hours.  Radiological Exams: No results found.  Assessment/Plan Active Problems:   Acute on chronic respiratory failure with hypoxia (HCC)   Chronic atrial fibrillation   Pneumonia of both lungs due to Pseudomonas species (HCC)   Pleural effusion, left   Muscular dystrophy (HCC)   1. Acute on chronic respiratory failure with hypoxia continue with T collar 20% FiO2 at this time.  Continue secretion management point toilet. 2. Chronic atrial fib and rate controlled 3. Bilateral pneumonia treating to follow 4. Pleural effusion status post or centesis 5. Muscular dystrophy at baseline continue to follow   I have personally seen and evaluated the patient, evaluated laboratory and imaging results, formulated the assessment and plan and placed orders. The Patient requires high complexity decision making for assessment and support.  Case was discussed on Rounds with the Respiratory Therapy Staff  Yevonne Pax, MD [facility] Pulmonary Critical Care Medicine Sleep Medicine

## 2018-08-03 DIAGNOSIS — J9621 Acute and chronic respiratory failure with hypoxia: Secondary | ICD-10-CM | POA: Diagnosis not present

## 2018-08-03 DIAGNOSIS — J90 Pleural effusion, not elsewhere classified: Secondary | ICD-10-CM | POA: Diagnosis not present

## 2018-08-03 DIAGNOSIS — G7100 Muscular dystrophy, unspecified: Secondary | ICD-10-CM | POA: Diagnosis not present

## 2018-08-03 DIAGNOSIS — I482 Chronic atrial fibrillation, unspecified: Secondary | ICD-10-CM | POA: Diagnosis not present

## 2018-08-03 NOTE — Progress Notes (Addendum)
Pulmonary Critical Care Medicine [facility] [place]   PULMONARY CRITICAL CARE SERVICE  PROGRESS NOTE  Date of Service: 08/03/2018  MCCARTHY GOLDWASSER  [id number]:[id number]  DOB: 1965/06/14   DOA: 06/24/2018  Referring Physician: Carron Curie, MD  HPI: Luis Choi is a 53 y.o. male seen for follow up of Acute on Chronic Respiratory Failure.  Patient remains on aerosol trach collar 28% FiO2 during the day.  Respiratory therapy continues report the patient desats at night and requires an increase up to 40% FiO2.  During the day uses PMV without difficulty.  No acute distress is noted.  Medications: Reviewed on Rounds  Physical Exam:  Vitals: Pulse 80 respirations 20 BP 107/67 O2 sat 94% temp 97.6  Ventilator Settings not currently on ventilator  . General: Comfortable at this time . Eyes: Grossly normal lids, irises & conjunctiva . ENT: grossly tongue is normal . Neck: no obvious mass . Cardiovascular: S1 S2 normal no gallop . Respiratory: No rales or rhonchi noted . Abdomen: soft . Skin: no rash seen on limited exam . Musculoskeletal: not rigid . Psychiatric:unable to assess . Neurologic: no seizure no involuntary movements         Lab Data:   Basic Metabolic Panel: Recent Labs  Lab 07/31/18 0353  NA 142  K 4.5  CL 105  CO2 30  GLUCOSE 141*  BUN 12  CREATININE 0.56*  CALCIUM 9.3    ABG: No results for input(s): PHART, PCO2ART, PO2ART, HCO3, O2SAT in the last 168 hours.  Liver Function Tests: No results for input(s): AST, ALT, ALKPHOS, BILITOT, PROT, ALBUMIN in the last 168 hours. No results for input(s): LIPASE, AMYLASE in the last 168 hours. No results for input(s): AMMONIA in the last 168 hours.  CBC: Recent Labs  Lab 07/31/18 0353  WBC 8.7  HGB 10.7*  HCT 35.4*  MCV 96.5  PLT 327    Cardiac Enzymes: No results for input(s): CKTOTAL, CKMB, CKMBINDEX, TROPONINI in the last 168 hours.  BNP (last 3 results) No results for input(s): BNP in  the last 8760 hours.  ProBNP (last 3 results) No results for input(s): PROBNP in the last 8760 hours.  Radiological Exams: No results found.  Assessment/Plan Active Problems:   Acute on chronic respiratory failure with hypoxia (HCC)   Chronic atrial fibrillation   Pneumonia of both lungs due to Pseudomonas species (HCC)   Pleural effusion, left   Muscular dystrophy (HCC)   1. Acute on chronic respiratory failure with hypoxia continue with T collar 28% during the day.  Continue secretion management and pulmonary toilet.  Next chronic atrial fibrillation rate controlled 2. Bilateral pneumonia treated continue to follow 3. Pleural effusion status post thoracentesis 4. Muscular dystrophy at baseline continue to follow   I have personally seen and evaluated the patient, evaluated laboratory and imaging results, formulated the assessment and plan and placed orders. The Patient requires high complexity decision making for assessment and support.  Case was discussed on Rounds with the Respiratory Therapy Staff  Yevonne Pax, MD [facility] Pulmonary Critical Care Medicine Sleep Medicine

## 2018-08-04 DIAGNOSIS — G7100 Muscular dystrophy, unspecified: Secondary | ICD-10-CM | POA: Diagnosis not present

## 2018-08-04 DIAGNOSIS — J9621 Acute and chronic respiratory failure with hypoxia: Secondary | ICD-10-CM | POA: Diagnosis not present

## 2018-08-04 DIAGNOSIS — I482 Chronic atrial fibrillation, unspecified: Secondary | ICD-10-CM | POA: Diagnosis not present

## 2018-08-04 DIAGNOSIS — J90 Pleural effusion, not elsewhere classified: Secondary | ICD-10-CM | POA: Diagnosis not present

## 2018-08-04 NOTE — Progress Notes (Signed)
Pulmonary Critical Care Medicine [facility] [place]   PULMONARY CRITICAL CARE SERVICE  PROGRESS NOTE  Date of Service: 08/04/2018  Luis Choi  [id number]  DOB: August 07, 1965   DOA: 06/24/2018  Referring Physician: Carron Curie, MD  HPI: Luis Choi is a 53 y.o. male seen for follow up of Acute on Chronic Respiratory Failure.  Patient is comfortable right now without distress on T collar.  Patient is having improved respiratory status except for some desaturations at nighttime.  We decided on rounds to begin capping and then try him on a positive airway pressure at nighttime to see how he does if this improves the issue then I think we might be able to continue with capping 24 hours  Medications: Reviewed on Rounds  Physical Exam:  Vitals: Temperature 98.7 pulse 78 respiratory 26 blood pressure is 110/70 saturation 97%  Ventilator Settings off the ventilator on T collar at this time  . General: Comfortable at this time . Eyes: Grossly normal lids, irises & conjunctiva . ENT: grossly tongue is normal . Neck: no obvious mass . Cardiovascular: S1 S2 normal no gallop . Respiratory: No rhonchi or rales are noted . Abdomen: soft . Skin: no rash seen on limited exam . Musculoskeletal: not rigid . Psychiatric:unable to assess . Neurologic: no seizure no involuntary movements         Lab Data:   Basic Metabolic Panel: Recent Labs  Lab 07/31/18 0353  NA 142  K 4.5  CL 105  CO2 30  GLUCOSE 141*  BUN 12  CREATININE 0.56*  CALCIUM 9.3    ABG: No results for input(s): PHART, PCO2ART, PO2ART, HCO3, O2SAT in the last 168 hours.  Liver Function Tests: No results for input(s): AST, ALT, ALKPHOS, BILITOT, PROT, ALBUMIN in the last 168 hours. No results for input(s): LIPASE, AMYLASE in the last 168 hours. No results for input(s): AMMONIA in the last 168 hours.  CBC: Recent Labs  Lab 07/31/18 0353  WBC 8.7  HGB 10.7*  HCT 35.4*  MCV 96.5  PLT 327     Cardiac Enzymes: No results for input(s): CKTOTAL, CKMB, CKMBINDEX, TROPONINI in the last 168 hours.  BNP (last 3 results) No results for input(s): BNP in the last 8760 hours.  ProBNP (last 3 results) No results for input(s): PROBNP in the last 8760 hours.  Radiological Exams: No results found.  Assessment/Plan Active Problems:   Acute on chronic respiratory failure with hypoxia (HCC)   Chronic atrial fibrillation   Pneumonia of both lungs due to Pseudomonas species (HCC)   Pleural effusion, left   Muscular dystrophy (HCC)   1. Acute on chronic respiratory failure with hypoxia begin capping trials as noted. 2. Chronic atrial fibrillation rate controlled 3. Pneumonia treated we will continue to monitor 4. Pleural effusion at baseline 5. Muscular dystrophy unchanged we will continue with supportive care   I have personally seen and evaluated the patient, evaluated laboratory and imaging results, formulated the assessment and plan and placed orders. The Patient requires high complexity decision making for assessment and support.  Case was discussed on Rounds with the Respiratory Therapy Staff  Yevonne Pax, MD [facility] Pulmonary Critical Care Medicine Sleep Medicine

## 2018-08-05 DIAGNOSIS — J90 Pleural effusion, not elsewhere classified: Secondary | ICD-10-CM | POA: Diagnosis not present

## 2018-08-05 DIAGNOSIS — G7100 Muscular dystrophy, unspecified: Secondary | ICD-10-CM | POA: Diagnosis not present

## 2018-08-05 DIAGNOSIS — I482 Chronic atrial fibrillation, unspecified: Secondary | ICD-10-CM | POA: Diagnosis not present

## 2018-08-05 DIAGNOSIS — J9621 Acute and chronic respiratory failure with hypoxia: Secondary | ICD-10-CM | POA: Diagnosis not present

## 2018-08-05 NOTE — Progress Notes (Addendum)
Pulmonary Critical Care Medicine [facility] [place]   PULMONARY CRITICAL CARE SERVICE  PROGRESS NOTE  Date of Service: 08/05/2018  MENASHE LYDEN  OFB:[id number]  DOB: [birth year]   DOA: 06/24/2018  Referring Physician: Carron Curie, MD  HPI: GOODMAN TOENNIES is a 53 y.o. male seen for follow up of Acute on Chronic Respiratory Failure.  Patient has now been 24 hours capped on room air and doing well.  At night he sleep with 2 L of oxygen and has minimal secretions at this time.  Medications: Reviewed on Rounds  Physical Exam:  Vitals: Pulse 75 respiration 17 BP 108/63 O2 sat 90% temp 98.2  Ventilator Settings not currently on ventilator  . General: Comfortable at this time . Eyes: Grossly normal lids, irises & conjunctiva . ENT: grossly tongue is normal . Neck: no obvious mass . Cardiovascular: S1 S2 normal no gallop . Respiratory: Rales or rhonchi noted . Abdomen: soft . Skin: no rash seen on limited exam . Musculoskeletal: not rigid . Psychiatric:unable to assess . Neurologic: no seizure no involuntary movements         Lab Data:   Basic Metabolic Panel: Recent Labs  Lab 07/31/18 0353  NA 142  K 4.5  CL 105  CO2 30  GLUCOSE 141*  BUN 12  CREATININE 0.56*  CALCIUM 9.3    ABG: No results for input(s): PHART, PCO2ART, PO2ART, HCO3, O2SAT in the last 168 hours.  Liver Function Tests: No results for input(s): AST, ALT, ALKPHOS, BILITOT, PROT, ALBUMIN in the last 168 hours. No results for input(s): LIPASE, AMYLASE in the last 168 hours. No results for input(s): AMMONIA in the last 168 hours.  CBC: Recent Labs  Lab 07/31/18 0353  WBC 8.7  HGB 10.7*  HCT 35.4*  MCV 96.5  PLT 327    Cardiac Enzymes: No results for input(s): CKTOTAL, CKMB, CKMBINDEX, TROPONINI in the last 168 hours.  BNP (last 3 results) No results for input(s): BNP in the last 8760 hours.  ProBNP (last 3 results) No results for input(s): PROBNP in the last 8760  hours.  Radiological Exams: No results found.  Assessment/Plan Active Problems:   Acute on chronic respiratory failure with hypoxia (HCC)   Chronic atrial fibrillation   Pneumonia of both lungs due to Pseudomonas species (HCC)   Pleural effusion, left   Muscular dystrophy (HCC)   1. Acute on chronic respiratory failure with hypoxia continue with capping trials and wearing oxygen at night.  Continue supportive measures 2. Chronic atrial fibrillation rate controlled next pneumonia treated continue to monitor 3. Pleural effusion at baseline 4. Muscular dystrophy unchanged continue supportive care   I have personally seen and evaluated the patient, evaluated laboratory and imaging results, formulated the assessment and plan and placed orders. The Patient requires high complexity decision making for assessment and support.  Case was discussed on Rounds with the Respiratory Therapy Staff  Yevonne Pax, MD [facility] Pulmonary Critical Care Medicine Sleep Medicine

## 2018-08-06 DIAGNOSIS — J90 Pleural effusion, not elsewhere classified: Secondary | ICD-10-CM | POA: Diagnosis not present

## 2018-08-06 DIAGNOSIS — J9621 Acute and chronic respiratory failure with hypoxia: Secondary | ICD-10-CM | POA: Diagnosis not present

## 2018-08-06 DIAGNOSIS — G7100 Muscular dystrophy, unspecified: Secondary | ICD-10-CM | POA: Diagnosis not present

## 2018-08-06 DIAGNOSIS — I482 Chronic atrial fibrillation, unspecified: Secondary | ICD-10-CM | POA: Diagnosis not present

## 2018-08-06 NOTE — Progress Notes (Addendum)
Pulmonary Critical Care Medicine [facility] [place]   PULMONARY CRITICAL CARE SERVICE  PROGRESS NOTE  Date of Service: 08/06/2018  Luis Choi  YYF:[id number]  DOB: 1965-07-01   DOA: 06/24/2018  Referring Physician: Carron Curie, MD  HPI: Luis Choi is a 53 y.o. male seen for follow up of Acute on Chronic Respiratory Failure.  Patient has been capped for 48 hours as of this afternoon.  He does drop his sats when he is napping and uses 2 L of oxygen via nasal cannula to combat this.  Medications: Reviewed on Rounds  Physical Exam:  Vitals: Pulse 84 respirations 15 BP 101/65 O2 sat 96 and temp 98.2  Ventilator Settings not currently on ventilator  . General: Comfortable at this time . Eyes: Grossly normal lids, irises & conjunctiva . ENT: grossly tongue is normal . Neck: no obvious mass . Cardiovascular: S1 S2 normal no gallop . Respiratory: No rales or rhonchi noted . Abdomen: soft . Skin: no rash seen on limited exam . Musculoskeletal: not rigid . Psychiatric:unable to assess . Neurologic: no seizure no involuntary movements         Lab Data:   Basic Metabolic Panel: Recent Labs  Lab 07/31/18 0353  NA 142  K 4.5  CL 105  CO2 30  GLUCOSE 141*  BUN 12  CREATININE 0.56*  CALCIUM 9.3    ABG: No results for input(s): PHART, PCO2ART, PO2ART, HCO3, O2SAT in the last 168 hours.  Liver Function Tests: No results for input(s): AST, ALT, ALKPHOS, BILITOT, PROT, ALBUMIN in the last 168 hours. No results for input(s): LIPASE, AMYLASE in the last 168 hours. No results for input(s): AMMONIA in the last 168 hours.  CBC: Recent Labs  Lab 07/31/18 0353  WBC 8.7  HGB 10.7*  HCT 35.4*  MCV 96.5  PLT 327    Cardiac Enzymes: No results for input(s): CKTOTAL, CKMB, CKMBINDEX, TROPONINI in the last 168 hours.  BNP (last 3 results) No results for input(s): BNP in the last 8760 hours.  ProBNP (last 3 results) No results for input(s): PROBNP in the  last 8760 hours.  Radiological Exams: No results found.  Assessment/Plan Active Problems:   Acute on chronic respiratory failure with hypoxia (HCC)   Chronic atrial fibrillation   Pneumonia of both lungs due to Pseudomonas species (HCC)   Pleural effusion, left   Muscular dystrophy (HCC)   1. Acute on chronic respiratory failure with hypoxia continue capping trials and weaning oxygen at night.  Continue supportive measures at this time. 2. Chronic atrial fibrillation rate controlled 3. Pneumonia treated continue to monitor 4. Pleural effusion at baseline 5. Muscular dystrophy unchanged continue supportive measures   I have personally seen and evaluated the patient, evaluated laboratory and imaging results, formulated the assessment and plan and placed orders. The Patient requires high complexity decision making for assessment and support.  Case was discussed on Rounds with the Respiratory Therapy Staff  Yevonne Pax, MD [facility] Pulmonary Critical Care Medicine Sleep Medicine

## 2018-08-07 DIAGNOSIS — J90 Pleural effusion, not elsewhere classified: Secondary | ICD-10-CM | POA: Diagnosis not present

## 2018-08-07 DIAGNOSIS — G7100 Muscular dystrophy, unspecified: Secondary | ICD-10-CM | POA: Diagnosis not present

## 2018-08-07 DIAGNOSIS — J9621 Acute and chronic respiratory failure with hypoxia: Secondary | ICD-10-CM | POA: Diagnosis not present

## 2018-08-07 DIAGNOSIS — I482 Chronic atrial fibrillation, unspecified: Secondary | ICD-10-CM | POA: Diagnosis not present

## 2018-08-07 NOTE — Progress Notes (Addendum)
Pulmonary Critical Care Medicine [facility] [place]   PULMONARY CRITICAL CARE SERVICE  PROGRESS NOTE  Date of Service: August 07, 2018  Luis Choi  EML:[id number]  DOB: 04-13-1966   DOA: 06/24/2018  Referring Physician: Carron Curie, MD  HPI: Luis Choi is a 53 y.o. male seen for follow up of Acute on Chronic Respiratory Failure.  Patient has now been capped for 72 hours and remains on 1 L oxygen via nasal cannula.  Patient has issues with BiPAP mask and is requiring a larger mask which respiratory having a difficult time allocating.  Change patient's BiPAP order to PRN until this can be resolved.  He does use 2 L of oxygen via nasal cannula overnight without desaturations or difficulty.  Medications: Reviewed on Rounds  Physical Exam:  Vitals: Pulse 78 respirations 18 BP 128/72 O2 sat 97% temp 98.1  Ventilator Settings not currently on ventilator  . General: Comfortable at this time . Eyes: Grossly normal lids, irises & conjunctiva . ENT: grossly tongue is normal . Neck: no obvious mass . Cardiovascular: S1 S2 normal no gallop . Respiratory: No rales or rhonchi noted . Abdomen: soft . Skin: no rash seen on limited exam . Musculoskeletal: not rigid . Psychiatric:unable to assess . Neurologic: no seizure no involuntary movements         Lab Data:   Basic Metabolic Panel: No results for input(s): NA, K, CL, CO2, GLUCOSE, BUN, CREATININE, CALCIUM, MG, PHOS in the last 168 hours.  ABG: No results for input(s): PHART, PCO2ART, PO2ART, HCO3, O2SAT in the last 168 hours.  Liver Function Tests: No results for input(s): AST, ALT, ALKPHOS, BILITOT, PROT, ALBUMIN in the last 168 hours. No results for input(s): LIPASE, AMYLASE in the last 168 hours. No results for input(s): AMMONIA in the last 168 hours.  CBC: No results for input(s): WBC, NEUTROABS, HGB, HCT, MCV, PLT in the last 168 hours.  Cardiac Enzymes: No results for input(s): CKTOTAL, CKMB,  CKMBINDEX, TROPONINI in the last 168 hours.  BNP (last 3 results) No results for input(s): BNP in the last 8760 hours.  ProBNP (last 3 results) No results for input(s): PROBNP in the last 8760 hours.  Radiological Exams: No results found.  Assessment/Plan Active Problems:   Acute on chronic respiratory failure with hypoxia (HCC)   Chronic atrial fibrillation   Pneumonia of both lungs due to Pseudomonas species (HCC)   Pleural effusion, left   Muscular dystrophy (HCC)   1. Acute on chronic respiratory failure with hypoxia continue capping trials and weaning oxygen overnight.  Continue supportive measures at this time 2. Chronic atrial fibrillation rate controlled 3. Pneumonia treated continue to monitor 4. Pleural effusion at baseline 5. Muscular dystrophy unchanged continue supportive measures   I have personally seen and evaluated the patient, evaluated laboratory and imaging results, formulated the assessment and plan and placed orders. The Patient requires high complexity decision making for assessment and support.  Case was discussed on Rounds with the Respiratory Therapy Staff  Yevonne Pax, MD [facility] Pulmonary Critical Care Medicine Sleep Medicine

## 2018-08-08 DIAGNOSIS — J90 Pleural effusion, not elsewhere classified: Secondary | ICD-10-CM | POA: Diagnosis not present

## 2018-08-08 DIAGNOSIS — I482 Chronic atrial fibrillation, unspecified: Secondary | ICD-10-CM | POA: Diagnosis not present

## 2018-08-08 DIAGNOSIS — G7100 Muscular dystrophy, unspecified: Secondary | ICD-10-CM | POA: Diagnosis not present

## 2018-08-08 DIAGNOSIS — J9621 Acute and chronic respiratory failure with hypoxia: Secondary | ICD-10-CM | POA: Diagnosis not present

## 2018-08-08 NOTE — Progress Notes (Addendum)
Pulmonary Critical Care Medicine [facility] [place]   PULMONARY CRITICAL CARE SERVICE  PROGRESS NOTE  Date of Service: 08/08/2018  Luis Choi  [id number]  DOB: 1966/01/11   DOA: 06/24/2018  Referring Physician: Carron Curie, MD  HPI: Luis Choi is a 53 y.o. male seen for follow up of Acute on Chronic Respiratory Failure.  Patient remains on 1 L of oxygen via nasal cannula.  Has orders for BiPAP as needed, however at night on 2 L of oxygen he tends to do well and does not need BiPAP.  We will continue monitor.  Medications: Reviewed on Rounds  Physical Exam:  Vitals: Pulse 77 respirations 18 BP 117/71 O2 sat 95% temp 97.5  Ventilator Settings not currently on ventilator  . General: Comfortable at this time . Eyes: Grossly normal lids, irises & conjunctiva . ENT: grossly tongue is normal . Neck: no obvious mass . Cardiovascular: S1 S2 normal no gallop . Respiratory: No rales or rhonchi noted . Abdomen: soft . Skin: no rash seen on limited exam . Musculoskeletal: not rigid . Psychiatric:unable to assess . Neurologic: no seizure no involuntary movements         Lab Data:   Basic Metabolic Panel: No results for input(s): NA, K, CL, CO2, GLUCOSE, BUN, CREATININE, CALCIUM, MG, PHOS in the last 168 hours.  ABG: No results for input(s): PHART, PCO2ART, PO2ART, HCO3, O2SAT in the last 168 hours.  Liver Function Tests: No results for input(s): AST, ALT, ALKPHOS, BILITOT, PROT, ALBUMIN in the last 168 hours. No results for input(s): LIPASE, AMYLASE in the last 168 hours. No results for input(s): AMMONIA in the last 168 hours.  CBC: No results for input(s): WBC, NEUTROABS, HGB, HCT, MCV, PLT in the last 168 hours.  Cardiac Enzymes: No results for input(s): CKTOTAL, CKMB, CKMBINDEX, TROPONINI in the last 168 hours.  BNP (last 3 results) No results for input(s): BNP in the last 8760 hours.  ProBNP (last 3 results) No results for input(s): PROBNP in  the last 8760 hours.  Radiological Exams: No results found.  Assessment/Plan Active Problems:   Acute on chronic respiratory failure with hypoxia (HCC)   Chronic atrial fibrillation   Pneumonia of both lungs due to Pseudomonas species (HCC)   Pleural effusion, left   Muscular dystrophy (HCC)   1. Acute on chronic tori failure with hypoxia continue Weaning Oxygen Overnight.  Can Supportive Measures at This Time 2. Chronic Atrial Fibrillation Rate Controlled 3. Pneumonia Treated Continue to Monitor 4. Pleural Effusion at Baseline 5. Muscular Dystrophy Unchanged Continue Supportive Measures   I have personally seen and evaluated the patient, evaluated laboratory and imaging results, formulated the assessment and plan and placed orders. The Patient requires high complexity decision making for assessment and support.  Case was discussed on Rounds with the Respiratory Therapy Staff  Yevonne Pax, MD [facility] Pulmonary Critical Care Medicine Sleep Medicine

## 2018-08-09 DIAGNOSIS — I482 Chronic atrial fibrillation, unspecified: Secondary | ICD-10-CM | POA: Diagnosis not present

## 2018-08-09 DIAGNOSIS — J9621 Acute and chronic respiratory failure with hypoxia: Secondary | ICD-10-CM | POA: Diagnosis not present

## 2018-08-09 DIAGNOSIS — J90 Pleural effusion, not elsewhere classified: Secondary | ICD-10-CM | POA: Diagnosis not present

## 2018-08-09 DIAGNOSIS — G7100 Muscular dystrophy, unspecified: Secondary | ICD-10-CM | POA: Diagnosis not present

## 2018-08-09 NOTE — Progress Notes (Addendum)
Pulmonary Critical Care Medicine [facility] [place]   PULMONARY CRITICAL CARE SERVICE  PROGRESS NOTE  Date of Service: 08/09/2018  SHERVIN LAMERS  [id number]  DOB: 29-Jan-1966   DOA: 06/24/2018  Referring Physician: Carron Curie, MD  HPI: Luis Choi is a 53 y.o. male seen for follow up of Acute on Chronic Respiratory Failure.  Patient doing well this time.  He is on room air during the day trached capped.  Uses 1 L of oxygen via nasal cannula at night.  No acute distress noted at this time.  Medications: Reviewed on Rounds  Physical Exam:  Vitals: Pulse 87 respirations 20 P10 9/69 O2 sat 95% temp 97.2  Ventilator Settings not currently on ventilator  . General: Comfortable at this time . Eyes: Grossly normal lids, irises & conjunctiva . ENT: grossly tongue is normal . Neck: no obvious mass . Cardiovascular: S1 S2 normal no gallop . Respiratory: No rales or rhonchi noted . Abdomen: soft . Skin: no rash seen on limited exam . Musculoskeletal: not rigid . Psychiatric:unable to assess . Neurologic: no seizure no involuntary movements         Lab Data:   Basic Metabolic Panel: No results for input(s): NA, K, CL, CO2, GLUCOSE, BUN, CREATININE, CALCIUM, MG, PHOS in the last 168 hours.  ABG: No results for input(s): PHART, PCO2ART, PO2ART, HCO3, O2SAT in the last 168 hours.  Liver Function Tests: No results for input(s): AST, ALT, ALKPHOS, BILITOT, PROT, ALBUMIN in the last 168 hours. No results for input(s): LIPASE, AMYLASE in the last 168 hours. No results for input(s): AMMONIA in the last 168 hours.  CBC: No results for input(s): WBC, NEUTROABS, HGB, HCT, MCV, PLT in the last 168 hours.  Cardiac Enzymes: No results for input(s): CKTOTAL, CKMB, CKMBINDEX, TROPONINI in the last 168 hours.  BNP (last 3 results) No results for input(s): BNP in the last 8760 hours.  ProBNP (last 3 results) No results for input(s): PROBNP in the last 8760  hours.  Radiological Exams: No results found.  Assessment/Plan Active Problems:   Acute on chronic respiratory failure with hypoxia (HCC)   Chronic atrial fibrillation   Pneumonia of both lungs due to Pseudomonas species (HCC)   Pleural effusion, left   Muscular dystrophy (HCC)   1. Acute on chronic respiratory failure with hypoxia continue weaning oxygen as tolerated.  Patient currently on requiring 1 L of oxygen via nasal cannula at night. 2. Chronic atrial fibrillation rate controlled 3. Pneumonia treated continue to monitor 4. Pleural effusion at baseline 5. Muscular dystrophy unchanged and supportive measures   I have personally seen and evaluated the patient, evaluated laboratory and imaging results, formulated the assessment and plan and placed orders. The Patient requires high complexity decision making for assessment and support.  Case was discussed on Rounds with the Respiratory Therapy Staff  Yevonne Pax, MD [facility] Pulmonary Critical Care Medicine Sleep Medicine

## 2018-08-10 DIAGNOSIS — J90 Pleural effusion, not elsewhere classified: Secondary | ICD-10-CM | POA: Diagnosis not present

## 2018-08-10 DIAGNOSIS — I482 Chronic atrial fibrillation, unspecified: Secondary | ICD-10-CM | POA: Diagnosis not present

## 2018-08-10 DIAGNOSIS — J9621 Acute and chronic respiratory failure with hypoxia: Secondary | ICD-10-CM | POA: Diagnosis not present

## 2018-08-10 DIAGNOSIS — G7100 Muscular dystrophy, unspecified: Secondary | ICD-10-CM | POA: Diagnosis not present

## 2018-08-10 NOTE — Progress Notes (Addendum)
Pulmonary Critical Care Medicine [facility] [place]   PULMONARY CRITICAL CARE SERVICE  PROGRESS NOTE  Date of Service: 08/10/2018  Luis Choi  AQT:[id number]  DOB: Jul 25, 1965   DOA: 06/24/2018  Referring Physician: Carron Curie, MD  HPI: DELAYNE Choi is a 53 y.o. male seen for follow up of Acute on Chronic Respiratory Failure.  Patient remains capped on 1 L of oxygen via nasal cannula.  He did not have any desaturations noted last night.  Satting well at this time.  Medications: Reviewed on Rounds  Physical Exam:  Vitals: Pulse 89 respirations 14 BP 130/73 O2 sat 96% and a 7.1  Ventilator Settings not currently on ventilator  . General: Comfortable at this time . Eyes: Grossly normal lids, irises & conjunctiva . ENT: grossly tongue is normal . Neck: no obvious mass . Cardiovascular: S1 S2 normal no gallop . Respiratory: No rales or rhonchi noted . Abdomen: soft . Skin: no rash seen on limited exam . Musculoskeletal: not rigid . Psychiatric:unable to assess . Neurologic: no seizure no involuntary movements         Lab Data:   Basic Metabolic Panel: No results for input(s): NA, K, CL, CO2, GLUCOSE, BUN, CREATININE, CALCIUM, MG, PHOS in the last 168 hours.  ABG: No results for input(s): PHART, PCO2ART, PO2ART, HCO3, O2SAT in the last 168 hours.  Liver Function Tests: No results for input(s): AST, ALT, ALKPHOS, BILITOT, PROT, ALBUMIN in the last 168 hours. No results for input(s): LIPASE, AMYLASE in the last 168 hours. No results for input(s): AMMONIA in the last 168 hours.  CBC: No results for input(s): WBC, NEUTROABS, HGB, HCT, MCV, PLT in the last 168 hours.  Cardiac Enzymes: No results for input(s): CKTOTAL, CKMB, CKMBINDEX, TROPONINI in the last 168 hours.  BNP (last 3 results) No results for input(s): BNP in the last 8760 hours.  ProBNP (last 3 results) No results for input(s): PROBNP in the last 8760 hours.  Radiological Exams: No  results found.  Assessment/Plan Active Problems:   Acute on chronic respiratory failure with hypoxia (HCC)   Chronic atrial fibrillation   Pneumonia of both lungs due to Pseudomonas species (HCC)   Pleural effusion, left   Muscular dystrophy (HCC)   1. Acute on chronic respiratory failure hypoxia continue oxygen as tolerated.  Patient is currently requiring 1 L of oxygen at night and during the day while capping.  Continue secretion management 2. Chronic fibrillation rate controlled 3. Pneumonia treated continue monitor 4. Pleural effusion at baseline 5. Muscular dystrophy unchanging supportive measures   I have personally seen and evaluated the patient, evaluated laboratory and imaging results, formulated the assessment and plan and placed orders. The Patient requires high complexity decision making for assessment and support.  Case was discussed on Rounds with the Respiratory Therapy Staff  Yevonne Pax, MD [facility] Pulmonary Critical Care Medicine Sleep Medicine

## 2018-08-11 DIAGNOSIS — J90 Pleural effusion, not elsewhere classified: Secondary | ICD-10-CM | POA: Diagnosis not present

## 2018-08-11 DIAGNOSIS — G7100 Muscular dystrophy, unspecified: Secondary | ICD-10-CM | POA: Diagnosis not present

## 2018-08-11 DIAGNOSIS — J9621 Acute and chronic respiratory failure with hypoxia: Secondary | ICD-10-CM | POA: Diagnosis not present

## 2018-08-11 DIAGNOSIS — I482 Chronic atrial fibrillation, unspecified: Secondary | ICD-10-CM | POA: Diagnosis not present

## 2018-08-11 NOTE — PMR Pre-admission (Signed)
PMR Admission Coordinator Pre-Admission Assessment  Patient: Luis Choi is an 53 y.o., male MRN: [id number] DOB: 04/05/1966 Height:   Weight:    Insurance Information HMO:     PPO: yes     PCP:      IPA:      80/20:      OTHER:  PRIMARY: [facility] [place]      Policy#: [id number]      Subscriber: patient's wife, Lattie Haw CM Name: Doylene Bode      Phone#:      Fax#:  Pre-Cert#: [id number], Otero received from Chad at [facility] [place] for 4/13 through 4/19 with updates due to [facility] on 4/20 at (p) [phone] [id number] [phone]      Employer:  Benefits:  Phone #: [phone]     Name:  Eff. Date: 11/01/17     Deduct: [phone]  (met [phone])    Out of Pocket Max: [phone] (met [phone]) Life Max:  n/a CIR: 80%      SNF: 80% limited to 100 days Outpatient:      Co-Pay: $25/ visit, limited to 90 visits across PT/OT/SLP Home Health: 80%      Co-Pay: 20% DME: 80%     Co-Pay: 20%  SECONDARY:       Policy#:       Subscriber:  CM Name:       Phone#:      Fax#:  Pre-Cert#:       Employer:  Benefits:  Phone #:      Name:  Eff. Date:      Deduct:       Out of Pocket Max:       Life Max:  CIR:       SNF:  Outpatient:      Co-Pay:  Home Health:       Co-Pay:  DME:      Co-Pay:   Medicaid Application Date:       Case Manager:  Disability Application Date:       Case Worker:   The "Data Collection Information Summary" for patients in Inpatient Rehabilitation Facilities with attached "Privacy Act [place] Records" was provided and verbally reviewed with: N/A  Emergency Contact Information Contact Information    Name Relation Home Work [place], [place] Spouse [place]      Current Medical History  Patient Admitting Diagnosis: Debility with Acute on Chronic Respiratory failure  History of Present Illness: Pt is a 54 y/o male with history of myotonic muscular dystrophy, paroxysmal atrial fib, and hypothyroidism who presented to referring hospital with AFib  with RVR.  Pt reports 2-3 months history of DOE and orthopnea.  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  He was intubated during his hospitalization, with trach placed on 1/26 after failed attempts at extubation.  Pt required cardioversion for Afib with RVR and was started on amiodarone for heart rate control.  Pt treated with adenosine and aspirin for SVT during admission, but this was discontinued 2/2 hemothorax.  Pt also treated for multidrug-resistant Pseudomonas penumonia with Zerbaxa, which is continued.  Pt developed peripheral eosinophilia which is being monitored as a possible side effect of Zerbaxa.  Blood sugars were elevated and pt started on Lantus and insulin on sliding scale.  Hospital course also complicated by anemia.  PEG was placed on 06/03/2018 for ongoing nutritional issues.  Pt was transferred to [facility] on 06/24/2018.  He was decannulated on 08/12/2018.  He  is tolerating a dys 3/thin diet.  Therapy recommendations were made for CIR.      Patient's medical record from [facility] has been reviewed by the rehabilitation admission coordinator and physician.  Past Medical History  Past Medical History:  Diagnosis Date  . Acute on chronic respiratory failure with hypoxia ([place])   . Chronic atrial fibrillation   . Muscular dystrophy ([place])   . Pleural effusion, left   . Pneumonia of both lungs due to Pseudomonas species [facility])     Family History   family history is not on file.  Prior Rehab/Hospitalizations Has the patient had prior rehab or hospitalizations prior to admission? No  Has the patient had major surgery during 100 days prior to admission? Trach and PEG placed   Current Medications See MAR from [facility]  Patients Current Diet:  Diet Order            Diet regular Room service appropriate? Yes; Fluid consistency: Thin  Diet effective now              Precautions / Restrictions Precautions Precautions: Fall   Has the  patient had 2 or more falls or a fall with injury in the past year? 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 Independent  Indoor Mobility: Did the patient need assistance with walking from room to room (with or without device)? Independent  Stairs: Did the patient need assistance with internal or external stairs (with or without device)? Independent  Functional Cognition: Did the patient need help planning regular tasks such as shopping or remembering to take medications? Independent  Home Assistive Devices / Equipment Home Assistive Devices/Equipment: None  Prior Device Use: Indicate devices/aids used by the patient prior to current illness, exacerbation or injury? None of the above   Prior Functional Level Current Functional Level  Bed Mobility    independent Min assist  Transfers  independent Min assist  Mobility - Walk/Wheelchair  independent Min assist x65' with RW  Upper Body Dressing  independent Supervision  Lower Body Dressing  independent Min assist  Grooming  independent supervision  Eating/Drinking  independent independent  Toilet Transfer  independent Max assist  Bladder Continence    continent continent  Bowel Management  continent  continent, last BM 08/13/2018  Stair Climbing   independent n/a  Communication  independent independent  Memory  independent independent  Driving  independent     Special needs/care consideration BiPAP/CPAP no CPM no Continuous Drip IV no Dialysis no        Days n/a Life Vest no Oxygen 1L via [place]  Special Bed no Trach Size decannulated on 08/12/2018 Wound Vac (area) no      Location n/a Skin intact                          Bowel mgmt: continent, last BM 08/13/2018 Bladder mgmt: continent Diabetic mgmt: lantus, and sliding scale  insulin Behavioral consideration no Chemo/radiation no   Previous Home Environment (from acute therapy documentation) Living Arrangements: Spouse/significant other, Children  Lives With: Spouse, Family Available Help at Discharge: Family, Available PRN/intermittently Type of Home: House Home Layout: One level Home Access: Stairs to enter Entrance Stairs-Rails: None Entrance Stairs-Number of Steps: 2 Bathroom Shower/Tub: Chiropodist: Standard Bathroom Accessibility: Yes How Accessible:  Accessible via walker Home Care Services: No  Discharge Living Setting Plans for Discharge Living Setting: Patient's home Type of Home at Discharge: House Discharge Home Layout: One level Discharge Home Access: Stairs to enter Entrance Stairs-Rails: None Entrance Stairs-Number of Steps: 2 Discharge Bathroom Shower/Tub: Tub/shower unit Discharge Bathroom Toilet: Standard Discharge Bathroom Accessibility: Yes How Accessible: Accessible via walker  Social/Family/Support Systems Patient Roles: Spouse, Parent Anticipated Caregiver: pt's wife Lattie Haw Anticipated Ambulance person Information: [phone] Ability/Limitations of Caregiver: works days Caregiver Availability: Intermittent Discharge Plan Discussed with Primary Caregiver: Yes, plan for Lattie Haw to stay home as long as she can.  If she needs to go back to work, pt will need to be mod I wheelchair level, at least.  Is Caregiver In Agreement with Plan?: Yes Does Caregiver/Family have Issues with Lodging/Transportation while Pt is in Rehab?: No  Goals/Additional Needs Patient/Family Goal for Rehab: PT/OT/SLP mod I Expected length of stay: 12-14 days Dietary Needs: dys 3, thin Equipment Needs: tbd Additional Information: pt with PMH of muscular dystrophy Pt/Family Agrees to Admission and willing to participate: Yes Program Orientation Provided & Reviewed with Pt/Caregiver Including Roles  & Responsibilities:  Yes   Possible need for SNF placement upon discharge: not anticipated  Patient Condition: I have reviewed medical records from [facility], spoken with CM, and patient and spouse. I met with patient at the bedside and discussed with wife via phone for inpatient rehabilitation assessment.  Patient will benefit from ongoing PT, OT and SLP, can actively participate in 3 hours of therapy a day 5 days of the week, and can make measurable gains during the admission.  Patient will also benefit from the coordinated team approach during an Inpatient Acute Rehabilitation admission.  The patient will receive intensive therapy as well as Rehabilitation physician, nursing, social worker, and care management interventions.  Due to bladder management, bowel management, safety, skin/wound care, disease management, medication administration, pain management and patient education the patient requires 24 hour a day rehabilitation nursing.  The patient is currently min for sit<>stand and ambulation and basic ADLs.  Discharge setting and therapy post discharge at home with home health is anticipated.  Patient has agreed to participate in the Acute Inpatient Rehabilitation Program and will admit today.  Preadmission Screen Completed By:  Michel Santee, 08/15/2018 1:35 PM ______________________________________________________________________   Discussed status with Dr. Naaman Plummer on 08/15/18  at 1:35 PM  and received approval for admission today.  Admission Coordinator:  Michel Santee, PT, time 1:35 PM Sudie Grumbling 08/15/18    Assessment/Plan: Diagnosis: debility after respiratory failure and multiple medical 1. Does the need for close, 24 hr/day Medical supervision in concert with the patient's rehab needs make it unreasonable for this patient to be served in a less intensive setting? Yes 2. Co-Morbidities requiring supervision/potential complications: pneumonia, dysphagia, muscular dystrophy 3. Due to bladder  management, bowel management, safety, skin/wound care, disease management, medication administration, pain management and patient education, does the patient require 24 hr/day rehab nursing? Yes 4. Does the patient require coordinated care of a physician, rehab nurse, PT (1-2 hrs/day, 5 days/week), OT (1-2 hrs/day, 5 days/week) and SLP (1-2 hrs/day, 5 days/week) to address physical and functional deficits in the context of the above medical diagnosis(es)? Yes Addressing deficits in the following areas: balance, endurance, locomotion, strength, transferring, bowel/bladder control, bathing, dressing, feeding, grooming, toileting, speech, swallowing and psychosocial support 5. Can the patient actively participate in an intensive therapy program of at least 3 hrs of therapy 5 days a  week? Yes 6. The potential for patient to make measurable gains while on inpatient rehab is excellent 7. Anticipated functional outcomes upon discharge from inpatients are: modified independent PT, modified independent OT, modified independent SLP 8. Estimated rehab length of stay to reach the above functional goals is: 12-14 days 9. Anticipated D/C setting: Home 10. Anticipated post D/C treatments: [place] therapy 11. Overall Rehab/Functional Prognosis: excellent  MD Signature Meredith Staggers, MD, [place] [facility] 08/15/2018

## 2018-08-11 NOTE — Progress Notes (Addendum)
Pulmonary Critical Care Medicine [facility] [place]   PULMONARY CRITICAL CARE SERVICE  PROGRESS NOTE  Date of Service: 08/11/2018  Luis Choi  [id number]:[id number]  DOB: [birth year]   DOA: 06/24/2018  Referring Physician: Carron Curie, MD  HPI: Luis Choi is a 53 y.o. male seen for follow up of Acute on Chronic Respiratory Failure.  Patient remains capped on 1 L of oxygen via nasal cannula.  He is no longer de-satting at night with this oxygen.  Overall patient is doing well and there are plans to transfer patient to inpatient rehab today.  Medications: Reviewed on Rounds  Physical Exam:  Vitals: Pulse 72 respirations 14 BP 110/68 O2 sat 96% temp 98.0  Ventilator Settings not currently on ventilator  . General: Comfortable at this time . Eyes: Grossly normal lids, irises & conjunctiva . ENT: grossly tongue is normal . Neck: no obvious mass . Cardiovascular: S1 S2 normal no gallop . Respiratory: No rales or rhonchi noted . Abdomen: soft . Skin: no rash seen on limited exam . Musculoskeletal: not rigid . Psychiatric:unable to assess . Neurologic: no seizure no involuntary movements         Lab Data:   Basic Metabolic Panel: No results for input(s): NA, K, CL, CO2, GLUCOSE, BUN, CREATININE, CALCIUM, MG, PHOS in the last 168 hours.  ABG: No results for input(s): PHART, PCO2ART, PO2ART, HCO3, O2SAT in the last 168 hours.  Liver Function Tests: No results for input(s): AST, ALT, ALKPHOS, BILITOT, PROT, ALBUMIN in the last 168 hours. No results for input(s): LIPASE, AMYLASE in the last 168 hours. No results for input(s): AMMONIA in the last 168 hours.  CBC: No results for input(s): WBC, NEUTROABS, HGB, HCT, MCV, PLT in the last 168 hours.  Cardiac Enzymes: No results for input(s): CKTOTAL, CKMB, CKMBINDEX, TROPONINI in the last 168 hours.  BNP (last 3 results) No results for input(s): BNP in the last 8760 hours.  ProBNP (last 3 results) No results for  input(s): PROBNP in the last 8760 hours.  Radiological Exams: No results found.  Assessment/Plan Active Problems:   Acute on chronic respiratory failure with hypoxia (HCC)   Chronic atrial fibrillation   Pneumonia of both lungs due to Pseudomonas species (HCC)   Pleural effusion, left   Muscular dystrophy (HCC)   1. Acute on chronic respiratory failure with hypoxia continue oxygen as tolerated.  Continue secretion management and pulmonary toilet 2. Chronic atrial fibrillation rate controlled 3. Pneumonia treated continue to monitor 4. Pleural effusion at baseline 5. Muscular dystrophy unchanging continue supportive measures   I have personally seen and evaluated the patient, evaluated laboratory and imaging results, formulated the assessment and plan and placed orders. The Patient requires high complexity decision making for assessment and support.  Case was discussed on Rounds with the Respiratory Therapy Staff  Yevonne Pax, MD [facility] Pulmonary Critical Care Medicine Sleep Medicine

## 2018-08-12 DIAGNOSIS — G7100 Muscular dystrophy, unspecified: Secondary | ICD-10-CM | POA: Diagnosis not present

## 2018-08-12 DIAGNOSIS — J9621 Acute and chronic respiratory failure with hypoxia: Secondary | ICD-10-CM | POA: Diagnosis not present

## 2018-08-12 DIAGNOSIS — J90 Pleural effusion, not elsewhere classified: Secondary | ICD-10-CM | POA: Diagnosis not present

## 2018-08-12 DIAGNOSIS — I482 Chronic atrial fibrillation, unspecified: Secondary | ICD-10-CM | POA: Diagnosis not present

## 2018-08-12 NOTE — Progress Notes (Addendum)
Pulmonary Critical Care Medicine [facility] [place]   PULMONARY CRITICAL CARE SERVICE  PROGRESS NOTE  Date of Service: 08/12/2018  Luis Choi  [id number]:[id number]  DOB: June 30, 1965   DOA: 06/24/2018  Referring Physician: Carron Curie, MD  HPI: Luis Choi is a 53 y.o. male seen for follow up of Acute on Chronic Respiratory Failure.  Patient remains on 1 L oxygen at this time.  He has thin secretions noted.  Medications: Reviewed on Rounds  Physical Exam:  Vitals: Pulse 75 respirations 15 BP 9662 O2 sat 95% telemetry 7.8  Ventilator Settings not on ventilator  . General: Comfortable at this time . Eyes: Grossly normal lids, irises & conjunctiva . ENT: grossly tongue is normal . Neck: no obvious mass . Cardiovascular: S1 S2 normal no gallop . Respiratory: No rales or rhonchi noted . Abdomen: soft . Skin: no rash seen on limited exam . Musculoskeletal: not rigid . Psychiatric:unable to assess . Neurologic: no seizure no involuntary movements         Lab Data:   Basic Metabolic Panel: No results for input(s): NA, K, CL, CO2, GLUCOSE, BUN, CREATININE, CALCIUM, MG, PHOS in the last 168 hours.  ABG: No results for input(s): PHART, PCO2ART, PO2ART, HCO3, O2SAT in the last 168 hours.  Liver Function Tests: No results for input(s): AST, ALT, ALKPHOS, BILITOT, PROT, ALBUMIN in the last 168 hours. No results for input(s): LIPASE, AMYLASE in the last 168 hours. No results for input(s): AMMONIA in the last 168 hours.  CBC: No results for input(s): WBC, NEUTROABS, HGB, HCT, MCV, PLT in the last 168 hours.  Cardiac Enzymes: No results for input(s): CKTOTAL, CKMB, CKMBINDEX, TROPONINI in the last 168 hours.  BNP (last 3 results) No results for input(s): BNP in the last 8760 hours.  ProBNP (last 3 results) No results for input(s): PROBNP in the last 8760 hours.  Radiological Exams: No results found.  Assessment/Plan Active Problems:   Acute on chronic  respiratory failure with hypoxia (HCC)   Chronic atrial fibrillation   Pneumonia of both lungs due to Pseudomonas species (HCC)   Pleural effusion, left   Muscular dystrophy (HCC)   1. Acute on chronic respiratory failure with hypoxia continue oxygen therapy as tolerated.  Continue secretion management pulmonary toilet. 2. Chronic A. fib relation rate controlled 3. Pneumonia treat continue to monitor 4. Pleural effusion 5. Muscular dystrophy unchanged continue supportive measures   I have personally seen and evaluated the patient, evaluated laboratory and imaging results, formulated the assessment and plan and placed orders. The Patient requires high complexity decision making for assessment and support.  Case was discussed on Rounds with the Respiratory Therapy Staff  Yevonne Pax, MD [facility] Pulmonary Critical Care Medicine Sleep Medicine

## 2018-08-13 DIAGNOSIS — G7100 Muscular dystrophy, unspecified: Secondary | ICD-10-CM | POA: Diagnosis not present

## 2018-08-13 DIAGNOSIS — J90 Pleural effusion, not elsewhere classified: Secondary | ICD-10-CM | POA: Diagnosis not present

## 2018-08-13 DIAGNOSIS — I482 Chronic atrial fibrillation, unspecified: Secondary | ICD-10-CM | POA: Diagnosis not present

## 2018-08-13 DIAGNOSIS — J9621 Acute and chronic respiratory failure with hypoxia: Secondary | ICD-10-CM | POA: Diagnosis not present

## 2018-08-13 NOTE — Progress Notes (Addendum)
Pulmonary Critical Care Medicine [facility] [place]   PULMONARY CRITICAL CARE SERVICE  PROGRESS NOTE  Date of Service: 08/13/2018  [name]  [id number]:[id number]  DOB: [birth year]   DOA: 06/24/2018  Referring Physician: Carron Curie, MD  HPI: Luis Choi is a 53 y.o. male seen for follow up of Acute on Chronic Respiratory Failure.  Patient was decannulated yesterday and is doing well today on room air.  No distress noted at this time.  Medications: Reviewed on Rounds  Physical Exam:  Vitals: Pulse 62 respirations 16 BP 112/64 O2 sat 94% temp 98.4  Ventilator Settings not currently on ventilator  . General: Comfortable at this time . Eyes: Grossly normal lids, irises & conjunctiva . ENT: grossly tongue is normal . Neck: no obvious mass . Cardiovascular: S1 S2 normal no gallop . Respiratory: No rales or rhonchi noted . Abdomen: soft . Skin: no rash seen on limited exam . Musculoskeletal: not rigid . Psychiatric:unable to assess . Neurologic: no seizure no involuntary movements         Lab Data:   Basic Metabolic Panel: No results for input(s): NA, K, CL, CO2, GLUCOSE, BUN, CREATININE, CALCIUM, MG, PHOS in the last 168 hours.  ABG: No results for input(s): PHART, PCO2ART, PO2ART, HCO3, O2SAT in the last 168 hours.  Liver Function Tests: No results for input(s): AST, ALT, ALKPHOS, BILITOT, PROT, ALBUMIN in the last 168 hours. No results for input(s): LIPASE, AMYLASE in the last 168 hours. No results for input(s): AMMONIA in the last 168 hours.  CBC: No results for input(s): WBC, NEUTROABS, HGB, HCT, MCV, PLT in the last 168 hours.  Cardiac Enzymes: No results for input(s): CKTOTAL, CKMB, CKMBINDEX, TROPONINI in the last 168 hours.  BNP (last 3 results) No results for input(s): BNP in the last 8760 hours.  ProBNP (last 3 results) No results for input(s): PROBNP in the last 8760 hours.  Radiological Exams: No results  found.  Assessment/Plan Active Problems:   Acute on chronic respiratory failure with hypoxia (HCC)   Chronic atrial fibrillation   Pneumonia of both lungs due to Pseudomonas species (HCC)   Pleural effusion, left   Muscular dystrophy (HCC)   1. Acute on chronic respiratory failure with hypoxia continue oxygen therapy as tolerated.  Continue secretion management and pulmonary toilet peer 2. Chronic atrial fibrillation rate controlled 3. Pneumonia treated continue to follow 4. Pleural effusion at baseline 5. Muscular dystrophy unchanging continue supportive measures   I have personally seen and evaluated the patient, evaluated laboratory and imaging results, formulated the assessment and plan and placed orders. The Patient requires high complexity decision making for assessment and support.  Case was discussed on Rounds with the Respiratory Therapy Staff  Yevonne Pax, MD [facility] Pulmonary Critical Care Medicine Sleep Medicine

## 2018-08-14 DIAGNOSIS — J90 Pleural effusion, not elsewhere classified: Secondary | ICD-10-CM | POA: Diagnosis not present

## 2018-08-14 DIAGNOSIS — G7100 Muscular dystrophy, unspecified: Secondary | ICD-10-CM | POA: Diagnosis not present

## 2018-08-14 DIAGNOSIS — J9621 Acute and chronic respiratory failure with hypoxia: Secondary | ICD-10-CM | POA: Diagnosis not present

## 2018-08-14 DIAGNOSIS — I482 Chronic atrial fibrillation, unspecified: Secondary | ICD-10-CM | POA: Diagnosis not present

## 2018-08-14 NOTE — Progress Notes (Addendum)
Pulmonary Critical Care Medicine [facility] [place]   PULMONARY CRITICAL CARE SERVICE  PROGRESS NOTE  Date of Service: 08/14/2018  Luis Choi  [id number]  DOB: [birth year]   DOA: 06/24/2018  Referring Physician: Carron Curie, MD  HPI: Luis Choi is a 53 y.o. male seen for follow up of Acute on Chronic Respiratory Failure.  Patient continues to do well on 1 L oxygen via nasal cannula.  No distress at this time.  Medications: Reviewed on Rounds  Physical Exam:  Vitals: Pulse 78 respirations 16 BP 93/62 O2 sat 95% 97.8  Ventilator Settings not currently on ventilator  . General: Comfortable at this time . Eyes: Grossly normal lids, irises & conjunctiva . ENT: grossly tongue is normal . Neck: no obvious mass . Cardiovascular: S1 S2 normal no gallop . Respiratory: No rales rhonchi noted . Abdomen: soft . Skin: no rash seen on limited exam . Musculoskeletal: not rigid . Psychiatric:unable to assess . Neurologic: no seizure no involuntary movements         Lab Data:   Basic Metabolic Panel: No results for input(s): NA, K, CL, CO2, GLUCOSE, BUN, CREATININE, CALCIUM, MG, PHOS in the last 168 hours.  ABG: No results for input(s): PHART, PCO2ART, PO2ART, HCO3, O2SAT in the last 168 hours.  Liver Function Tests: No results for input(s): AST, ALT, ALKPHOS, BILITOT, PROT, ALBUMIN in the last 168 hours. No results for input(s): LIPASE, AMYLASE in the last 168 hours. No results for input(s): AMMONIA in the last 168 hours.  CBC: No results for input(s): WBC, NEUTROABS, HGB, HCT, MCV, PLT in the last 168 hours.  Cardiac Enzymes: No results for input(s): CKTOTAL, CKMB, CKMBINDEX, TROPONINI in the last 168 hours.  BNP (last 3 results) No results for input(s): BNP in the last 8760 hours.  ProBNP (last 3 results) No results for input(s): PROBNP in the last 8760 hours.  Radiological Exams: No results found.  Assessment/Plan Active Problems:   Acute on  chronic respiratory failure with hypoxia (HCC)   Chronic atrial fibrillation   Pneumonia of both lungs due to Pseudomonas species (HCC)   Pleural effusion, left   Muscular dystrophy (HCC)   1. Acute chronic respiratory failure with hypoxia continue oxygen therapy as tolerated.  Continue secretion management and pulmonary toilet. 2. Chronic atrial fibrillation rate controlled 3. Pneumonia treated continue to follow 4. Pleural effusion at baseline 5. Muscular dystrophy unchanging continue supportive measures   I have personally seen and evaluated the patient, evaluated laboratory and imaging results, formulated the assessment and plan and placed orders. The Patient requires high complexity decision making for assessment and support.  Case was discussed on Rounds with the Respiratory Therapy Staff  Yevonne Pax, MD [facility] Pulmonary Critical Care Medicine Sleep Medicine

## 2018-08-15 ENCOUNTER — Encounter (HOSPITAL_COMMUNITY): Payer: Self-pay | Admitting: Physical Medicine and Rehabilitation

## 2018-08-15 ENCOUNTER — Other Ambulatory Visit: Payer: Self-pay

## 2018-08-15 ENCOUNTER — Inpatient Hospital Stay (HOSPITAL_COMMUNITY)
Admission: RE | Admit: 2018-08-15 | Discharge: 2018-08-26 | DRG: 945 | Disposition: A | Discharge status: Home Health | Payer: BLUE CROSS/BLUE SHIELD | Source: Other Acute Inpatient Hospital | Attending: Physical Medicine & Rehabilitation | Admitting: Physical Medicine & Rehabilitation

## 2018-08-15 DIAGNOSIS — R1312 Dysphagia, oropharyngeal phase: Secondary | ICD-10-CM

## 2018-08-15 DIAGNOSIS — R490 Dysphonia: Secondary | ICD-10-CM | POA: Diagnosis present

## 2018-08-15 DIAGNOSIS — Z9049 Acquired absence of other specified parts of digestive tract: Secondary | ICD-10-CM

## 2018-08-15 DIAGNOSIS — G7111 Myotonic muscular dystrophy: Secondary | ICD-10-CM | POA: Diagnosis present

## 2018-08-15 DIAGNOSIS — D62 Acute posthemorrhagic anemia: Secondary | ICD-10-CM | POA: Diagnosis present

## 2018-08-15 DIAGNOSIS — E039 Hypothyroidism, unspecified: Secondary | ICD-10-CM | POA: Diagnosis present

## 2018-08-15 DIAGNOSIS — Z7982 Long term (current) use of aspirin: Secondary | ICD-10-CM

## 2018-08-15 DIAGNOSIS — Z8052 Family history of malignant neoplasm of bladder: Secondary | ICD-10-CM

## 2018-08-15 DIAGNOSIS — Z888 Allergy status to other drugs, medicaments and biological substances status: Secondary | ICD-10-CM | POA: Diagnosis not present

## 2018-08-15 DIAGNOSIS — I482 Chronic atrial fibrillation, unspecified: Secondary | ICD-10-CM | POA: Diagnosis present

## 2018-08-15 DIAGNOSIS — Z79899 Other long term (current) drug therapy: Secondary | ICD-10-CM | POA: Diagnosis not present

## 2018-08-15 DIAGNOSIS — J9382 Other air leak: Secondary | ICD-10-CM | POA: Diagnosis present

## 2018-08-15 DIAGNOSIS — R5381 Other malaise: Secondary | ICD-10-CM | POA: Diagnosis present

## 2018-08-15 DIAGNOSIS — Z820 Family history of epilepsy and other diseases of the nervous system: Secondary | ICD-10-CM | POA: Diagnosis not present

## 2018-08-15 DIAGNOSIS — D638 Anemia in other chronic diseases classified elsewhere: Secondary | ICD-10-CM | POA: Diagnosis present

## 2018-08-15 DIAGNOSIS — Z87891 Personal history of nicotine dependence: Secondary | ICD-10-CM

## 2018-08-15 DIAGNOSIS — Z794 Long term (current) use of insulin: Secondary | ICD-10-CM | POA: Diagnosis not present

## 2018-08-15 DIAGNOSIS — J9621 Acute and chronic respiratory failure with hypoxia: Secondary | ICD-10-CM | POA: Diagnosis present

## 2018-08-15 DIAGNOSIS — Z1624 Resistance to multiple antibiotics: Secondary | ICD-10-CM | POA: Diagnosis present

## 2018-08-15 DIAGNOSIS — R05 Cough: Secondary | ICD-10-CM | POA: Diagnosis present

## 2018-08-15 DIAGNOSIS — Z79890 Hormone replacement therapy (postmenopausal): Secondary | ICD-10-CM | POA: Diagnosis not present

## 2018-08-15 DIAGNOSIS — R9389 Abnormal findings on diagnostic imaging of other specified body structures: Secondary | ICD-10-CM

## 2018-08-15 DIAGNOSIS — I480 Paroxysmal atrial fibrillation: Secondary | ICD-10-CM | POA: Diagnosis present

## 2018-08-15 DIAGNOSIS — G7100 Muscular dystrophy, unspecified: Secondary | ICD-10-CM | POA: Diagnosis present

## 2018-08-15 DIAGNOSIS — M7989 Other specified soft tissue disorders: Secondary | ICD-10-CM | POA: Diagnosis not present

## 2018-08-15 DIAGNOSIS — Z8249 Family history of ischemic heart disease and other diseases of the circulatory system: Secondary | ICD-10-CM | POA: Diagnosis not present

## 2018-08-15 DIAGNOSIS — R948 Abnormal results of function studies of other organs and systems: Secondary | ICD-10-CM | POA: Diagnosis present

## 2018-08-15 DIAGNOSIS — E119 Type 2 diabetes mellitus without complications: Secondary | ICD-10-CM

## 2018-08-15 DIAGNOSIS — Z931 Gastrostomy status: Secondary | ICD-10-CM

## 2018-08-15 DIAGNOSIS — Z79891 Long term (current) use of opiate analgesic: Secondary | ICD-10-CM

## 2018-08-15 DIAGNOSIS — I4891 Unspecified atrial fibrillation: Secondary | ICD-10-CM

## 2018-08-15 DIAGNOSIS — J90 Pleural effusion, not elsewhere classified: Secondary | ICD-10-CM | POA: Diagnosis present

## 2018-08-15 DIAGNOSIS — R1310 Dysphagia, unspecified: Secondary | ICD-10-CM | POA: Diagnosis present

## 2018-08-15 HISTORY — DX: Hepatitis a without hepatic coma: B15.9

## 2018-08-15 LAB — GLUCOSE, CAPILLARY: Glucose-Capillary: 115 mg/dL — ABNORMAL HIGH (ref 70–99)

## 2018-08-15 MED ORDER — GUAIFENESIN-DM 100-10 MG/5ML PO SYRP: 5.0000 mL | ORAL_SOLUTION | Freq: Four times a day (QID) | ORAL | Status: DC | PRN

## 2018-08-15 MED ORDER — GUAIFENESIN 100 MG/5ML PO SOLN
10.0000 mL | Freq: Three times a day (TID) | ORAL | Status: DC
  Administered 2018-08-15 – 2018-08-26 (×42): 200 mg via ORAL
  Filled 2018-08-15: qty 50
  Filled 2018-08-15 (×2): qty 10
  Filled 2018-08-15: qty 50
  Filled 2018-08-15 (×5): qty 10
  Filled 2018-08-15: qty 50
  Filled 2018-08-15 (×2): qty 10
  Filled 2018-08-15 (×2): qty 5
  Filled 2018-08-15 (×7): qty 10
  Filled 2018-08-15 (×2): qty 50
  Filled 2018-08-15 (×2): qty 10
  Filled 2018-08-15: qty 5
  Filled 2018-08-15 (×4): qty 10
  Filled 2018-08-15 (×2): qty 25
  Filled 2018-08-15: qty 10
  Filled 2018-08-15: qty 5
  Filled 2018-08-15 (×2): qty 10
  Filled 2018-08-15: qty 25
  Filled 2018-08-15: qty 50
  Filled 2018-08-15 (×3): qty 10
  Filled 2018-08-15: qty 50

## 2018-08-15 MED ORDER — FLEET ENEMA 7-19 GM/118ML RE ENEM: 1.0000 | ENEMA | Freq: Once | RECTAL | Status: DC | PRN

## 2018-08-15 MED ORDER — AMIODARONE HCL 200 MG PO TABS
200.0000 mg | ORAL_TABLET | Freq: Every day | ORAL | Status: DC
  Administered 2018-08-16 – 2018-08-26 (×11): 200 mg via ORAL
  Filled 2018-08-15 (×11): qty 1

## 2018-08-15 MED ORDER — INSULIN GLARGINE 100 UNIT/ML ~~LOC~~ SOLN
10.0000 [IU] | Freq: Every day | SUBCUTANEOUS | Status: DC
  Administered 2018-08-15 – 2018-08-17 (×3): 10 [IU] via SUBCUTANEOUS
  Filled 2018-08-15 (×4): qty 0.1

## 2018-08-15 MED ORDER — BISACODYL 10 MG RE SUPP: 10.0000 mg | Freq: Every day | RECTAL | Status: DC | PRN

## 2018-08-15 MED ORDER — ENOXAPARIN SODIUM 40 MG/0.4ML ~~LOC~~ SOLN
40.0000 mg | SUBCUTANEOUS | Status: DC
  Administered 2018-08-15 – 2018-08-25 (×11): 40 mg via SUBCUTANEOUS
  Filled 2018-08-15 (×11): qty 0.4

## 2018-08-15 MED ORDER — TRAZODONE HCL 50 MG PO TABS
25.0000 mg | ORAL_TABLET | Freq: Every evening | ORAL | Status: DC | PRN
  Administered 2018-08-16 – 2018-08-17 (×2): 50 mg via ORAL
  Filled 2018-08-15 (×3): qty 1

## 2018-08-15 MED ORDER — HYDROCODONE-ACETAMINOPHEN 5-325 MG PO TABS: 1.0000 | ORAL_TABLET | Freq: Four times a day (QID) | ORAL | Status: DC | PRN

## 2018-08-15 MED ORDER — DIPHENHYDRAMINE HCL 12.5 MG/5ML PO ELIX: 12.5000 mg | ORAL_SOLUTION | Freq: Four times a day (QID) | ORAL | Status: DC | PRN

## 2018-08-15 MED ORDER — VITAMIN C 500 MG PO TABS
500.0000 mg | ORAL_TABLET | Freq: Every day | ORAL | Status: DC
  Administered 2018-08-16 – 2018-08-26 (×11): 500 mg via ORAL
  Filled 2018-08-15 (×11): qty 1

## 2018-08-15 MED ORDER — PROCHLORPERAZINE MALEATE 5 MG PO TABS: 5.0000 mg | ORAL_TABLET | Freq: Four times a day (QID) | ORAL | Status: DC | PRN

## 2018-08-15 MED ORDER — PROCHLORPERAZINE EDISYLATE 10 MG/2ML IJ SOLN: 5.0000 mg | Freq: Four times a day (QID) | INTRAMUSCULAR | Status: DC | PRN

## 2018-08-15 MED ORDER — POLYETHYLENE GLYCOL 3350 17 G PO PACK: 17.0000 g | PACK | Freq: Every day | ORAL | Status: DC | PRN

## 2018-08-15 MED ORDER — ACETAMINOPHEN 325 MG PO TABS: 325.0000 mg | ORAL_TABLET | ORAL | Status: DC | PRN

## 2018-08-15 MED ORDER — ALUM & MAG HYDROXIDE-SIMETH 200-200-20 MG/5ML PO SUSP: 30.0000 mL | ORAL | Status: DC | PRN

## 2018-08-15 MED ORDER — ASPIRIN 325 MG PO TABS
325.0000 mg | ORAL_TABLET | Freq: Every day | ORAL | Status: DC
  Administered 2018-08-16 – 2018-08-26 (×11): 325 mg via ORAL
  Filled 2018-08-15 (×11): qty 1

## 2018-08-15 MED ORDER — NON FORMULARY: 3.0000 mg | Freq: Every day | Status: DC

## 2018-08-15 MED ORDER — LEVOTHYROXINE SODIUM 112 MCG PO TABS
112.0000 ug | ORAL_TABLET | Freq: Every day | ORAL | Status: DC
  Administered 2018-08-16 – 2018-08-26 (×10): 112 ug via ORAL
  Filled 2018-08-15 (×10): qty 1

## 2018-08-15 MED ORDER — PROCHLORPERAZINE 25 MG RE SUPP: 12.5000 mg | Freq: Four times a day (QID) | RECTAL | Status: DC | PRN

## 2018-08-15 NOTE — Progress Notes (Signed)
Patient admitted to IP Rehab. Oriented  to unit and staff. Denies pain at time currently resting in bed.

## 2018-08-15 NOTE — H&P (Signed)
Physical Medicine and Rehabilitation Admission H&P    CC: Debility   HPI:  Luis Choi is a 53 year old male with history of myotonic muscular dystrophy, PAF, hypothyroidism who was admitted to OSH with A. fib with RVR, 2 to 3 months of DOE/orthopnea and required intubation.  He was cardioverted and started on amiodarone for heart rate control and treated with sotalol for episode of SVT.  He failed attempts at extubation therefore required tracheostomy on 05/29/18.  He was found to have multidrug-resistant Pseudomonas as well as suspicious mass in the chest treated with Zerbaxa.  Hospital course significant for eosinophilia as possible side effect of Zyprexa, left chest hemothorax due to ASA , anemia requiring transfusion, dysphagia --PEG tube placed on 06/03/18 and mild addisonian crisis requiring stress dose steroids.  He was transferred to [facility] on 06/24/2018 for vent weaning. Repeat sputum cultures showed ESBL pseudomonas and Klebsiella and antibiotics changed to Gentamicin/cipro.  CT chest done revealing moderate to large bilateral pleural effusions with subtotal collapse of lower lobes and  3 cm X 1.3 cm X 1.5 cm mass in RML worrisome for lung CA and 1.2 cm mass in RUL- inflammatory v/s neoplastic. He underwent thoracocentesis of 1 L amber fluid on 02/25 and pathology negative for malignancy. He has been weaned off vent-->BIPAP and decannulated by 04/10. Has been tolerating dysphagia 3, thins without difficulty. Respiratory status has improved and he is showing improvement in activity tolerance but noted to be deconditioned. CIR recommended due to functional decline.     Review of Systems  Constitutional: Negative for chills and fever.  HENT: Negative for hearing loss and tinnitus.   Eyes: Negative for blurred vision and double vision.  Respiratory: Positive for shortness of breath. Negative for cough and wheezing.   Cardiovascular: Negative for chest pain and palpitations.   Gastrointestinal: Negative for constipation, heartburn and nausea.       Irregular BM  Genitourinary: Negative for dysuria, frequency and urgency.  Musculoskeletal: Negative for back pain, joint pain and myalgias.  Skin: Negative for rash.  Neurological: Positive for weakness. Negative for dizziness and headaches.  Psychiatric/Behavioral: Negative for depression. The patient does not have insomnia.      Past Medical History:  Diagnosis Date  . Acute on chronic respiratory failure with hypoxia (HCC)   . Chronic atrial fibrillation   . Hepatitis A   . Muscular dystrophy (HCC)   . Pleural effusion, left   . Pneumonia of both lungs due to Pseudomonas species [facility])     Past Surgical History:  Procedure Laterality Date  . CHOLECYSTECTOMY  2010   CBD cyst with enlarged duct/cyst  . COLONOSCOPY W/ POLYPECTOMY    . PANCREATIC CYST DRAINAGE  2010   due trauma  . THYROIDECTOMY, PARTIAL Right 12/2009      Family History  Problem Relation Age of Onset  . Bladder Cancer Father   . Heart failure Father   . Muscular dystrophy Sister   . Muscular dystrophy Brother   . Dementia Mother      Social History: Married. Independent  PTA.  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 He used to smoke cigarettes--for 15 years. Quit in 2009/.  Has one alcoholic drink per year for celebration. He     Allergies:  Allergies  Allergen Reactions  . Fluticasone Other (See Comments)    Nose bleed  Medications Prior to Admission  Medication Sig Dispense Refill  . acetaminophen (TYLENOL) 325 MG tablet Take 650 mg by mouth every 6 (six) hours as needed for mild pain, fever or headache.    . Amino Acids-Protein Hydrolys (FEEDING SUPPLEMENT, PRO-STAT SUGAR FREE 64,) LIQD Take 30 mLs by mouth 2 (two) times daily.    Marland Kitchen. amiodarone (PACERONE) 200 MG tablet Take 200 mg by mouth daily.     Marland Kitchen. aspirin 325 MG EC tablet Take 325 mg by mouth daily.    Marland Kitchen. enoxaparin (LOVENOX) 40 MG/0.4ML injection Inject 40 mg into the skin daily.    Marland Kitchen. guaiFENesin (ROBITUSSIN) 100 MG/5ML liquid Take 200 mg by mouth 4 (four) times daily.    Marland Kitchen. HYDROcodone-acetaminophen (NORCO/VICODIN) 5-325 MG tablet Take 1 tablet by mouth every 6 (six) hours as needed for moderate pain.    Marland Kitchen. insulin glargine (LANTUS) 100 UNIT/ML injection Inject 10 Units into the skin daily.    Marland Kitchen. levothyroxine (SYNTHROID, LEVOTHROID) 112 MCG tablet Take 112 mcg by mouth daily before breakfast.    . Melatonin 3 MG TABS Take 3 mg by mouth at bedtime.    . Multiple Vitamins-Minerals (MULTIVITAMINS THER. W/MINERALS) TABS tablet Take 1 tablet by mouth daily.    . pantoprazole (PROTONIX) 40 MG tablet Take 40 mg by mouth daily.    . vitamin C (ASCORBIC ACID) 500 MG tablet Take 500 mg by mouth daily.      Drug Regimen Review  Drug regimen was reviewed and remains appropriate with no significant issues identified  Home: Home Living Family/patient expects to be discharged to:: Private residence Living Arrangements: Spouse/significant other   Functional History: Independent PTA  Functional Status:  Mobility: Min assist for transfers  Min assist for ambulating 5175' with RW     ADL: Supervision for UB dressing. Min assist for LB dressing.   Cognition: Cognition Orientation Level: Oriented X4     Weight 86.4 kg. Physical Exam  Nursing note and vitals reviewed. Constitutional: He is oriented to person, place, and time. He appears well-developed and well-nourished.  HENT:  Head: Normocephalic and atraumatic.  Eyes: Pupils are equal, round, and reactive to light. EOM are normal.  Neck:  Trach stoma covered, remains open with air leak during speech  Cardiovascular: Normal rate and regular rhythm. Exam reveals no friction rub.  No murmur heard. Respiratory: Effort normal. No respiratory distress. He has no wheezes.  GI: Soft.  He exhibits no distension. There is no abdominal tenderness.  PEG site with dried blood and debris  Musculoskeletal:        General: No edema.  Neurological: He is alert and oriented to person, place, and time.  Speech dysphonic. Reasonable insight and awareness. Normal language, follows all commands.  UE 4-5/5 prox to distal except for 3/5 strength in HI right more preserved than left. LE: 3-4/5 prox to distal.   Skin: Skin is warm and dry.  Psychiatric:  Flat but cooperative    No results found for this or any previous visit (from the past 48 hour(s)). No results found.     Medical Problem List and Plan: 1.  Functional and mobility deficits secondary to respiratory failure and multiple medical issues  -admit to inpatient rehab 2.  Antithrombotics: -DVT/anticoagulation:    -antiplatelet therapy:   3. Pain Management:   4. Mood:    -antipsychotic agents: n/a 5. Neuropsych: This patient  is capable of making decisions on his own behalf. 6. Skin/Wound Care:   7. Fluids/Electrolytes/Nutrition:  Jacquelynn Cree, PA-C 08/15/2018

## 2018-08-15 NOTE — H&P (Signed)
Physical Medicine and Rehabilitation Admission H&P     CC: Debility     HPI:  Luis Choi is a 53 year old male with history of myotonic muscular dystrophy, PAF, hypothyroidism who was admitted to OSH with A. fib with RVR, 2 to 3 months of DOE/orthopnea and required intubation.  He was cardioverted and started on amiodarone for heart rate control and treated with sotalol for episode of SVT.  He failed attempts at extubation therefore required tracheostomy on 05/29/18.  He was found to have multidrug-resistant Pseudomonas as well as suspicious mass in the chest treated with Zerbaxa.  Hospital course significant for eosinophilia as possible side effect of Zyprexa, left chest hemothorax due to ASA , anemia requiring transfusion, dysphagia --PEG tube placed on 06/03/18 and mild addisonian crisis requiring stress dose steroids.  [facility] for vent weaning. Repeat sputum cultures showed ESBL pseudomonas and Klebsiella and antibiotics changed to Gentamicin/cipro.  CT chest done revealing moderate to large bilateral pleural effusions with subtotal collapse of lower lobes and  3 cm X 1.3 cm X 1.5 cm mass in RML worrisome for lung CA and 1.2 cm mass in RUL- inflammatory v/s neoplastic. He underwent thoracocentesis of 1 L amber fluid on 02/25 and pathology negative for malignancy. He has been weaned off vent-->BIPAP and decannulated by 04/10. Has been tolerating dysphagia 3, thins without difficulty. Respiratory status has improved and he is showing improvement in activity tolerance but noted to be deconditioned. CIR recommended due to functional decline.       Review of Systems  Constitutional: Negative for chills and fever.  HENT: Negative for hearing loss and tinnitus.   Eyes: Negative for blurred vision and double vision.  Respiratory: Positive for shortness of breath. Negative for cough and wheezing.   Cardiovascular: Negative for chest pain and palpitations.   Gastrointestinal: Negative for constipation, heartburn and nausea.       Irregular BM  Genitourinary: Negative for dysuria, frequency and urgency.  Musculoskeletal: Negative for back pain, joint pain and myalgias.  Skin: Negative for rash.  Neurological: Positive for weakness. Negative for dizziness and headaches.  Psychiatric/Behavioral: Negative for depression. The patient does not have insomnia.           Past Medical History:  Diagnosis Date  . Acute on chronic respiratory failure with hypoxia (HCC)    . Chronic atrial fibrillation    . Hepatitis A    . Muscular dystrophy (HCC)    . Pleural effusion, left    . Pneumonia of both lungs due to Pseudomonas species [facility](HCC)        Past Surgical History:  Procedure Laterality Date  . CHOLECYSTECTOMY   2010    CBD cyst with enlarged duct/cyst  . COLONOSCOPY W/ POLYPECTOMY      . PANCREATIC CYST DRAINAGE   2010    due trauma  . THYROIDECTOMY, PARTIAL Right 12/2009             Family History  Problem Relation Age of Onset  . Bladder Cancer Father    . Heart failure Father    . Muscular dystrophy Sister    . Muscular dystrophy Brother    . Dementia Mother        Social History: Married. Independent  PTA.  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 He used  to smoke cigarettes--for 15 years. Quit in 2009/.  Has one alcoholic drink per year for celebration. He       Allergies:       Allergies  Allergen Reactions  . Fluticasone Other (See Comments)      Nose bleed                Medications Prior to Admission  Medication Sig Dispense Refill  . acetaminophen (TYLENOL) 325 MG tablet Take 650 mg by mouth every 6 (six) hours as needed for mild pain, fever or headache.      . Amino Acids-Protein Hydrolys (FEEDING SUPPLEMENT, PRO-STAT SUGAR FREE 64,) LIQD Take 30 mLs by mouth 2 (two) times daily.      Marland Kitchen amiodarone  (PACERONE) 200 MG tablet Take 200 mg by mouth daily.      Marland Kitchen aspirin 325 MG EC tablet Take 325 mg by mouth daily.      Marland Kitchen enoxaparin (LOVENOX) 40 MG/0.4ML injection Inject 40 mg into the skin daily.      Marland Kitchen guaiFENesin (ROBITUSSIN) 100 MG/5ML liquid Take 200 mg by mouth 4 (four) times daily.      Marland Kitchen HYDROcodone-acetaminophen (NORCO/VICODIN) 5-325 MG tablet Take 1 tablet by mouth every 6 (six) hours as needed for moderate pain.      Marland Kitchen insulin glargine (LANTUS) 100 UNIT/ML injection Inject 10 Units into the skin daily.      Marland Kitchen levothyroxine (SYNTHROID, LEVOTHROID) 112 MCG tablet Take 112 mcg by mouth daily before breakfast.      . Melatonin 3 MG TABS Take 3 mg by mouth at bedtime.      . Multiple Vitamins-Minerals (MULTIVITAMINS THER. W/MINERALS) TABS tablet Take 1 tablet by mouth daily.      . pantoprazole (PROTONIX) 40 MG tablet Take 40 mg by mouth daily.      . vitamin C (ASCORBIC ACID) 500 MG tablet Take 500 mg by mouth daily.          Drug Regimen Review  Drug regimen was reviewed and remains appropriate with no significant issues identified   Home: Home Living Family/patient expects to be discharged to:: Private residence Living Arrangements: Spouse/significant other   Functional History: Independent PTA   Functional Status:  Mobility: Min assist for transfers  Min assist for ambulating 72' with RW     ADL: Supervision for UB dressing. Min assist for LB dressing.    Cognition: Cognition Orientation Level: Oriented X4     Weight 86.4 kg. Physical Exam  Nursing note and vitals reviewed. Constitutional: He is oriented to person, place, and time. He appears well-developed and well-nourished.  HENT:  Head: Normocephalic and atraumatic.  Eyes: Pupils are equal, round, and reactive to light. EOM are normal.  Neck:  Trach stoma covered, remains open with air leak during speech  Cardiovascular: Normal rate and regular rhythm. Exam reveals no friction rub.  No murmur heard.  Respiratory: Effort normal. No respiratory distress. He has no wheezes.  GI: Soft. He exhibits no distension. There is no abdominal tenderness.  PEG site with dried blood and debris  Musculoskeletal:        General: No edema.  Neurological: He is alert and oriented to person, place, and time.  Speech dysphonic. Reasonable insight and awareness. Normal language, follows all commands.  UE 4-5/5 prox to distal except for 3/5 strength in HI right more preserved than left. LE: 3-4/5 prox to distal.   Skin: Skin is warm and dry.  Psychiatric:  Flat but cooperative  Lab Results Last 48 Hours  No results found for this or any previous visit (from the past 48 hour(s)).   Imaging Results (Last 48 hours)  No results found.           Medical Problem List and Plan: 1.  Functional and mobility deficits secondary to respiratory failure and multiple medical issues             -admit to inpatient rehab 2.  Antithrombotics: -DVT/anticoagulation:  lovenox  q12  -check dopplers             -antiplatelet therapy: asa  3. Pain Management:  tylenol and hydrocodone 4. Mood:               -antipsychotic agents: n/a  -trazodone 25-50mg  qhs for sleep 5. Neuropsych: This patient  is capable of making decisions on his own behalf. 6. Skin/Wound Care:  local care to trach/PEG sites to promote healing  -maximize nutrition 7. Fluids/Electrolytes/Nutrition:  encourage PO  -D3 and thins diet currently  -check labs on admit 8. Type 2 DM:  lantus 10u qhs, SSI as needed 9. Pulmonary: decannulated, s/p thoracentesis---negative for malignancy  -oxygen therapy via  to keep sats >90%  -pt has not tolerated CPAP---revisit as outpt   -robitussin dm prn for cough 10. Atrial fibrillation: amiodarone for rate controlled        Post Admission Physician Evaluation: 1. Functional deficits secondary  to debility. 2. Patient is admitted to receive collaborative, interdisciplinary care between the  physiatrist, rehab nursing staff, and therapy team. 3. Patient's level of medical complexity and substantial therapy needs in context of that medical necessity cannot be provided at a lesser intensity of care such as a SNF. 4. Patient has experienced substantial functional loss from his/her baseline which was documented above under the "Functional History" and "Functional Status" headings.  Judging by the patient's diagnosis, physical exam, and functional history, the patient has potential for functional progress which will result in measurable gains while on inpatient rehab.  These gains will be of substantial and practical use upon discharge  in facilitating mobility and self-care at the household level. 5. Physiatrist will provide 24 hour management of medical needs as well as oversight of the therapy plan/treatment and provide guidance as appropriate regarding the interaction of the two. 6. The Preadmission Screening has been reviewed and patient status is unchanged unless otherwise stated above. 7. 24 hour rehab nursing will assist with bladder management, bowel management, safety, skin/wound care, disease management, medication administration, pain management and patient education  and help integrate therapy concepts, techniques,education, etc. 8. PT will assess and treat for/with: Lower extremity strength, range of motion, stamina, balance, functional mobility, safety, adaptive techniques and equipment, NMR, activity tolerance.   Goals are: mod I. 9. OT will assess and treat for/with: ADL's, functional mobility, safety, upper extremity strength, adaptive techniques and equipment, NMR, activity tolerance.   Goals are: mod I. Therapy may proceed with showering this patient. 10. SLP will assess and treat for/with: speech, swallowing.  Goals are: mod I. 11. Case Management and Social Worker will assess and treat for psychological issues and discharge planning. 12. Team conference will be held weekly to  assess progress toward goals and to determine barriers to discharge. 13. Patient will receive at least 3 hours of therapy per day at least 5 days per week. 14. ELOS: 12-14 days       15. Prognosis:  excellent   I have personally performed a face to face  diagnostic evaluation of this patient and formulated the key components of the plan.  Additionally, I have personally reviewed laboratory data, imaging studies, as well as relevant notes and concur with the physician assistant's documentation above.  Ranelle Oyster, MD, [place] [facility]       Jacquelynn Cree, PA-C 08/15/2018

## 2018-08-15 NOTE — Progress Notes (Signed)
Meredith Staggers, MD  Physician  Physical Medicine and Rehabilitation  PMR Pre-admission  Signed  Date of Service:  08/11/2018 12:31 PM       Related encounter: Admission (Discharged) from 06/24/2018 in [place] all '[x]' Manual'[x]' Template'[]' Copied  Added by: '[x]' Meredith Staggers, MD'[x]' Michel Santee, PT  '[]' Hover for details PMR Admission Coordinator Pre-Admission Assessment  Patient: Luis Choi is an 53 y.o., male MRN: [id number] DOB: 1966-01-22 Height:   Weight:    Insurance Information HMO:     PPO: yes     PCP:      IPA:      80/20:      OTHER:  [facility] [place]      Policy#: [id number]      Subscriber: patient's wife, Luis Choi CM Name: Doylene Bode      Phone#:      Fax#:  Pre-Cert#: [id number], Geraldine received from Chad at [facility] [place] for 4/13 through 4/19 with updates due to [facility] on 4/20 at (p) [phone] [id number] [phone]      Employer:  Benefits:  Phone #: [phone]     Name:  Eff. Date: 11/01/17     Deduct: [phone]  (met [phone])    Out of Pocket Max: [phone] (met [phone]) Life Max:  n/a CIR: 80%      SNF: 80% limited to 100 days Outpatient:      Co-Pay: $25/ visit, limited to 90 visits across PT/OT/SLP Home Health: 80%      Co-Pay: 20% DME: 80%     Co-Pay: 20%  SECONDARY:       Policy#:       Subscriber:  CM Name:       Phone#:      Fax#:  Pre-Cert#:       Employer:  Benefits:  Phone #:      Name:  Eff. Date:      Deduct:       Out of Pocket Max:       Life Max:  CIR:       SNF:  Outpatient:      Co-Pay:  Home Health:       Co-Pay:  DME:      Co-Pay:   Medicaid Application Date:       Case Manager:  Disability Application Date:       Case Worker:   The Data Collection Information Summary for patients in Inpatient Rehabilitation Facilities with attached Privacy Act [place] Records was provided and verbally reviewed with: N/A  Emergency Contact Information         Contact Information    Name Relation Home Work Hebo, Villanueva Spouse Throop      Current Medical History  Patient Admitting Diagnosis: Debility with Acute on Chronic Respiratory failure  History of Present Illness: Pt is a 53 y/o male with history of myotonic muscular dystrophy, paroxysmal atrial fib, and hypothyroidism who presented to referring hospital with AFib with RVR.  Pt reports 2-3 months history of DOE and orthopnea.  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  He was intubated during his hospitalization, with trach placed on 1/26 after failed attempts at extubation.  Pt required cardioversion for Afib with RVR and was started on amiodarone for heart rate control.  Pt treated with adenosine and aspirin for SVT during admission, but this was discontinued 2/2 hemothorax.  Pt also treated for multidrug-resistant Pseudomonas penumonia with Zerbaxa, which is continued.  Pt developed peripheral eosinophilia which is being monitored as a possible side effect of Zerbaxa.  Blood sugars were elevated and pt started on Lantus and insulin on sliding scale.  Hospital course also complicated by anemia.  PEG was placed on 06/03/2018 for ongoing nutritional issues.  Pt was transferred to [facility] on 06/24/2018.  He was decannulated on 08/12/2018.  He is tolerating a dys 3/thin diet.  Therapy recommendations were made for CIR.    Patient's medical record from [facility] has been reviewed by the rehabilitation admission coordinator and physician.  Past Medical History      Past Medical History:  Diagnosis Date   Acute on chronic respiratory failure with hypoxia (HCC)    Chronic atrial fibrillation    Muscular dystrophy ([place])    Pleural effusion, left    Pneumonia of both lungs due to Pseudomonas species [facility])     Family History   family history is not on file.  Prior Rehab/Hospitalizations Has the patient had prior rehab or  hospitalizations prior to admission? No  Has the patient had major surgery during 100 days prior to admission? Trach and PEG placed   Current Medications See MAR from [facility]  Patients Current Diet:     Diet Order                  Diet regular Room service appropriate? Yes; Fluid consistency: Thin  Diet effective now               Precautions / Restrictions Precautions Precautions: Fall   Has the patient had 2 or more falls or a fall with injury in the past year? 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 Independent  Indoor Mobility: Did the patient need assistance with walking from room to room (with or without device)? Independent  Stairs: Did the patient need assistance with internal or external stairs (with or without device)? Independent  Functional Cognition: Did the patient need help planning regular tasks such as shopping or remembering to take medications? Independent  Home Assistive Devices / Equipment Home Assistive Devices/Equipment: None  Prior Device Use: Indicate devices/aids used by the patient prior to current illness, exacerbation or injury? None of the above   Prior Functional Level Current Functional Level  Bed Mobility  independent Min assist  Transfers  independent Min assist  Mobility - Walk/Wheelchair  independent Min assist x31' with RW  Upper Body Dressing  independent Supervision  Lower Body Dressing  independent Min assist  Grooming  independent supervision  Eating/Drinking  independent independent  Toilet Transfer  independent Max assist  Bladder Continence    continent continent  Bowel Management  continent  continent, last BM 08/13/2018  Stair Climbing   independent n/a  Communication  independent  independent  Memory  independent independent  Driving  independent     Special needs/care consideration BiPAP/CPAP no CPM no Continuous Drip IV no Dialysis no        Days n/a Life Vest no Oxygen 1L via [place]  Special Bed no Trach Size decannulated on 08/12/2018 Wound Vac (area) no      Location n/a Skin intact  Bowel mgmt: continent, last BM 08/13/2018 Bladder mgmt: continent Diabetic mgmt: lantus, and sliding scale insulin Behavioral consideration no Chemo/radiation no   Previous Home Environment (from acute therapy documentation) Living Arrangements: Spouse/significant other, Children  Lives With: Spouse, Family Available Help at Discharge: Family, Available PRN/intermittently Type of Home: House Home Layout: One level Home Access: Stairs to enter Entrance Stairs-Rails: None Entrance Stairs-Number of Steps: 2 Bathroom Shower/Tub: Optometrist: Yes How Accessible: Accessible via walker [place]: No  Discharge Living Setting Plans for Discharge Living Setting: Patient's home Type of Home at Discharge: House Discharge Home Layout: One level Discharge Home Access: Stairs to enter Entrance Stairs-Rails: None Entrance Stairs-Number of Steps: 2 Discharge Bathroom Shower/Tub: Tub/shower unit Discharge Bathroom Toilet: Standard Discharge Bathroom Accessibility: Yes How Accessible: Accessible via walker  Social/Family/Support Systems Patient Roles: Spouse, Parent Anticipated Caregiver: pt's wife Luis Choi Anticipated Ambulance person Information: [phone] Ability/Limitations of Caregiver: works days Caregiver Availability: Intermittent Discharge Plan Discussed with Primary Caregiver: Yes, plan for Luis Choi to stay home as long as she can.  If she needs to go back to work, pt will need to be mod I wheelchair level, at least.  Is Caregiver In Agreement with Plan?: Yes Does  Caregiver/Family have Issues with Lodging/Transportation while Pt is in Rehab?: No  Goals/Additional Needs Patient/Family Goal for Rehab: PT/OT/SLP mod I Expected length of stay: 12-14 days Dietary Needs: dys 3, thin Equipment Needs: tbd Additional Information: pt with PMH of muscular dystrophy Pt/Family Agrees to Admission and willing to participate: Yes Program Orientation Provided & Reviewed with Pt/Caregiver Including Roles  & Responsibilities: Yes   Possible need for SNF placement upon discharge: not anticipated  Patient Condition: I have reviewed medical records from [facility], spoken with CM, and patient and spouse. I met with patient at the bedside and discussed with wife via phone for inpatient rehabilitation assessment.  Patient will benefit from ongoing PT, OT and SLP, can actively participate in 3 hours of therapy a day 5 days of the week, and can make measurable gains during the admission.  Patient will also benefit from the coordinated team approach during an Inpatient Acute Rehabilitation admission.  The patient will receive intensive therapy as well as Rehabilitation physician, nursing, social worker, and care management interventions.  Due to bladder management, bowel management, safety, skin/wound care, disease management, medication administration, pain management and patient education the patient requires 24 hour a day rehabilitation nursing.  The patient is currently min for sit<>stand and ambulation and basic ADLs.  Discharge setting and therapy post discharge at home with home health is anticipated.  Patient has agreed to participate in the Acute Inpatient Rehabilitation Program and will admit today.  Preadmission Screen Completed By:  Michel Santee, 08/15/2018 1:35 PM ______________________________________________________________________   Discussed status with Dr. Naaman Plummer on 08/15/18  at 1:35 PM  and received approval for admission today.  Admission  Coordinator:  Michel Santee, PT, time 1:35 PM Sudie Grumbling 08/15/18    Assessment/Plan: Diagnosis: debility after respiratory failure and multiple medical 1. Does the need for close, 24 hr/day Medical supervision in concert with the patient's rehab needs make it unreasonable for this patient to be served in a less intensive setting? Yes 2. Co-Morbidities requiring supervision/potential complications: pneumonia, dysphagia, muscular dystrophy 3. Due to bladder management, bowel management, safety, skin/wound care, disease management, medication administration, pain management and patient education, does the patient require 24 hr/day rehab nursing? Yes 4. Does the patient require coordinated care  of a physician, rehab nurse, PT (1-2 hrs/day, 5 days/week), OT (1-2 hrs/day, 5 days/week) and SLP (1-2 hrs/day, 5 days/week) to address physical and functional deficits in the context of the above medical diagnosis(es)? Yes Addressing deficits in the following areas: balance, endurance, locomotion, strength, transferring, bowel/bladder control, bathing, dressing, feeding, grooming, toileting, speech, swallowing and psychosocial support 5. Can the patient actively participate in an intensive therapy program of at least 3 hrs of therapy 5 days a week? Yes 6. The potential for patient to make measurable gains while on inpatient rehab is excellent 7. Anticipated functional outcomes upon discharge from inpatients are: modified independent PT, modified independent OT, modified independent SLP 8. Estimated rehab length of stay to reach the above functional goals is: 12-14 days 9. Anticipated D/C setting: Home 10. Anticipated post D/C treatments: [place] therapy 11. Overall Rehab/Functional Prognosis: excellent  MD Signature Meredith Staggers, MD, [place] Physical Medicine & Rehabilitation 08/15/2018         Revision History

## 2018-08-16 ENCOUNTER — Inpatient Hospital Stay (HOSPITAL_COMMUNITY): Payer: Self-pay | Admitting: Speech Pathology

## 2018-08-16 ENCOUNTER — Inpatient Hospital Stay (HOSPITAL_COMMUNITY): Payer: Self-pay | Admitting: Physical Therapy

## 2018-08-16 ENCOUNTER — Inpatient Hospital Stay (HOSPITAL_COMMUNITY): Payer: Self-pay

## 2018-08-16 DIAGNOSIS — I482 Chronic atrial fibrillation, unspecified: Secondary | ICD-10-CM

## 2018-08-16 DIAGNOSIS — D638 Anemia in other chronic diseases classified elsewhere: Secondary | ICD-10-CM

## 2018-08-16 LAB — CBC WITH DIFFERENTIAL/PLATELET
Abs Immature Granulocytes: 0.06 10*3/uL (ref 0.00–0.07)
Basophils Absolute: 0.1 10*3/uL (ref 0.0–0.1)
Basophils Relative: 1 %
Eosinophils Absolute: 0.7 10*3/uL — ABNORMAL HIGH (ref 0.0–0.5)
Eosinophils Relative: 7 %
HCT: 34.7 % — ABNORMAL LOW (ref 39.0–52.0)
Hemoglobin: 10.3 g/dL — ABNORMAL LOW (ref 13.0–17.0)
Immature Granulocytes: 1 %
Lymphocytes Relative: 20 %
Lymphs Abs: 2 10*3/uL (ref 0.7–4.0)
MCH: 28.8 pg (ref 26.0–34.0)
MCHC: 29.7 g/dL — ABNORMAL LOW (ref 30.0–36.0)
MCV: 96.9 fL (ref 80.0–100.0)
Monocytes Absolute: 1.2 10*3/uL — ABNORMAL HIGH (ref 0.1–1.0)
Monocytes Relative: 12 %
Neutro Abs: 5.9 10*3/uL (ref 1.7–7.7)
Neutrophils Relative %: 59 %
Platelets: 341 10*3/uL (ref 150–400)
RBC: 3.58 MIL/uL — ABNORMAL LOW (ref 4.22–5.81)
RDW: 14.7 % (ref 11.5–15.5)
WBC: 9.9 10*3/uL (ref 4.0–10.5)
nRBC: 0 % (ref 0.0–0.2)

## 2018-08-16 LAB — COMPREHENSIVE METABOLIC PANEL
ALT: 56 U/L — ABNORMAL HIGH (ref 0–44)
AST: 43 U/L — ABNORMAL HIGH (ref 15–41)
Albumin: 2.9 g/dL — ABNORMAL LOW (ref 3.5–5.0)
Alkaline Phosphatase: 416 U/L — ABNORMAL HIGH (ref 38–126)
Anion gap: 10 (ref 5–15)
BUN: 22 mg/dL — ABNORMAL HIGH (ref 6–20)
CO2: 28 mmol/L (ref 22–32)
Calcium: 9.1 mg/dL (ref 8.9–10.3)
Chloride: 104 mmol/L (ref 98–111)
Creatinine, Ser: 0.74 mg/dL (ref 0.61–1.24)
GFR calc Af Amer: >60 mL/min (ref >60)
GFR calc non Af Amer: >60 mL/min (ref >60)
Glucose, Bld: 109 mg/dL — ABNORMAL HIGH (ref 70–99)
Potassium: 3.9 mmol/L (ref 3.5–5.1)
Sodium: 142 mmol/L (ref 135–145)
Total Bilirubin: 0.4 mg/dL (ref 0.3–1.2)
Total Protein: 6.4 g/dL — ABNORMAL LOW (ref 6.5–8.1)

## 2018-08-16 LAB — GLUCOSE, CAPILLARY: Glucose-Capillary: 117 mg/dL — ABNORMAL HIGH (ref 70–99)

## 2018-08-16 MED ORDER — PANTOPRAZOLE SODIUM 40 MG PO TBEC
40.0000 mg | DELAYED_RELEASE_TABLET | Freq: Every day | ORAL | Status: DC
  Administered 2018-08-17 – 2018-08-26 (×10): 40 mg via ORAL
  Filled 2018-08-16 (×10): qty 1

## 2018-08-16 MED ORDER — ADULT MULTIVITAMIN W/MINERALS CH: 1.0000 | ORAL_TABLET | Freq: Every day | ORAL | Status: DC

## 2018-08-16 MED ORDER — ENSURE ENLIVE PO LIQD
237.0000 mL | Freq: Two times a day (BID) | ORAL | Status: DC
  Administered 2018-08-16 – 2018-08-26 (×18): 237 mL via ORAL

## 2018-08-16 MED ORDER — ADULT MULTIVITAMIN W/MINERALS CH
1.0000 | ORAL_TABLET | Freq: Every day | ORAL | Status: DC
  Administered 2018-08-17 – 2018-08-26 (×10): 1 via ORAL
  Filled 2018-08-16 (×10): qty 1

## 2018-08-16 NOTE — Evaluation (Signed)
Occupational Therapy Assessment and Plan  Patient Details  Name: Luis Choi MRN: [id number] Date of Birth: May 13, 1965  OT Diagnosis: muscle weakness (generalized) Rehab Potential: Rehab Potential (ACUTE ONLY): Good ELOS: 14-18 days   Today's Date: 08/16/2018 OT Individual Time: 1330-1430 OT Individual Time Calculation (min): 60 min     Problem List:  Patient Active Problem List   Diagnosis Date Noted  . Debility 08/15/2018  . Acute on chronic respiratory failure with hypoxia ([place])   . Chronic atrial fibrillation   . Pneumonia of both lungs due to Pseudomonas species ([place])   . Pleural effusion, left   . Muscular dystrophy [facility])     Past Medical History:  Past Medical History:  Diagnosis Date  . Acute on chronic respiratory failure with hypoxia ([place])   . Chronic atrial fibrillation   . Hepatitis A   . Muscular dystrophy ([place])   . Pleural effusion, left   . Pneumonia of both lungs due to Pseudomonas species [facility])    Past Surgical History:  Past Surgical History:  Procedure Laterality Date  . CATARACT EXTRACTION, BILATERAL  2018   with implants  . CHOLECYSTECTOMY  2010   CBD cyst with enlarged duct/cyst  . COLONOSCOPY W/ POLYPECTOMY    . PANCREATIC CYST DRAINAGE  2010   due trauma  . THYROIDECTOMY, PARTIAL Right 12/2009    Assessment & Plan Clinical Impression: Luis Choi is a34 year old male with history of myotonic muscular dystrophy, PAF, hypothyroidism who was admitted to OSH with A. fib with RVR, 2 to 3 months of DOE/orthopnea and required intubation. He was cardioverted and started onamiodarone for heart rate controland treated with sotalol for episode ofSVT. He failed attempts at extubation therefore required tracheostomy on 05/29/18. He was found to have multidrug-resistant Pseudomonas as well as suspicious mass in the chest treated with Zerbaxa.Hospital course significant for eosinophilia as possible side effect of Zyprexa, left chest hemothorax  due to ASA ,anemia requiring transfusion, dysphagia --PEG tube placed on 06/03/18 andmild addisonian crisis requiring stress dose steroids. He was transferred to [facility] on 06/24/2018 for vent weaning. Repeat sputum cultures showed ESBL pseudomonas and Klebsiella and antibiotics changed to Gentamicin/cipro. CT chest done revealing moderate to large bilateral pleural effusions with subtotal collapse of lower lobes and 3 cm X 1.3 cm X 1.5 cm mass in RML worrisome for lung CA and 1.2 cm mass in RUL- inflammatory v/s neoplastic. He underwent thoracocentesis of 1 L amber fluid on 02/25 and pathology negative for malignancy. He has been weaned off vent-->BIPAP and decannulated by 04/10. Has been tolerating dysphagia 3, thins without difficulty. Respiratory status has improved and he is showing improvement in activity tolerance but noted to be deconditioned. CIR recommended due to functional decline.   Patient transferred to CIR on 08/15/2018 .    Patient currently requires min with basic self-care skills secondary to muscle weakness, decreased memory and decreased sitting balance, decreased standing balance and decreased balance strategies.  Prior to hospitalization, patient could complete ADLs with modified independent .  Patient will benefit from skilled intervention to decrease level of assist with basic self-care skills prior to discharge home with care partner.  Anticipate patient will require intermittent supervision and follow up home health.  OT - End of Session Activity Tolerance: Tolerates 10 - 20 min activity with multiple rests Endurance Deficit: Yes Endurance Deficit Description: decreased OT Assessment Rehab Potential (ACUTE ONLY): Good OT Patient demonstrates impairments in the following area(s): Balance;Sensory;Skin Integrity;Endurance;Motor;Safety OT Basic ADL's Functional Problem(s): Dressing;Toileting;Bathing  OT Transfers Functional Problem(s): Toilet;Tub/Shower OT Additional  Impairment(s): None OT Plan OT Intensity: Minimum of 1-2 x/day, 45 to 90 minutes OT Frequency: 5 out of 7 days OT Duration/Estimated Length of Stay: 14-18 days OT Treatment/Interventions: Balance/vestibular training;Disease mangement/prevention;Self Care/advanced ADL retraining;Therapeutic Exercise;Wheelchair propulsion/positioning;Cognitive remediation/compensation;DME/adaptive equipment instruction;Pain management;UE/LE Strength taining/ROM;Community reintegration;Patient/family education;UE/LE Coordination activities;Therapeutic Activities;Psychosocial support;Functional mobility training;Discharge planning OT Self Feeding Anticipated Outcome(s): no goal set OT Basic Self-Care Anticipated Outcome(s): (S) OT Toileting Anticipated Outcome(s): (S) OT Bathroom Transfers Anticipated Outcome(s): (S) OT Recommendation Patient destination: Home Follow Up Recommendations: Home health OT Equipment Recommended: To be determined   Skilled Therapeutic Intervention Skilled OT evaluation initiated with POC, ELOS, and rehab expectations explained. Pt completed bed mobility to EOB with CGA. While EOB pt had several LOB posteriorly and to the L, but was able to self correct. Pt completed UB bathing with CGA sitting EOB. Pt able to don shirt (S) as well. Pt completed LB bathing with min A. Pt donned socks with mod A, attempting to use sock aid previously issued by other OT. Pt completed sit > stand with x3 attempts with mod A. Pt used RW to complete functional mobility to sink with min A. Pt able to complete oral care at sink with (S). Pt was brought down to therapy gym to complete 9 hole peg test and dynamometer. Pt returned to his room and left supine in bed with all needs met.    OT Evaluation Precautions/Restrictions  Precautions Precautions: Fall Restrictions Weight Bearing Restrictions: No General Chart Reviewed: Yes Family/Caregiver Present: No   Pain Pain Assessment Pain Scale: 0-10 Pain  Score: 0-No pain Home Living/Prior Functioning Home Living Family/patient expects to be discharged to:: Private residence Living Arrangements: Spouse/significant other Available Help at Discharge: Family, Available PRN/intermittently Type of Home: House Home Access: Stairs to enter [employer] of Steps: 2 from garage Entrance Stairs-Rails: None Home Layout: One level Bathroom Shower/Tub: Chiropodist: Handicapped height Bathroom Accessibility: Yes  Lives With: Spouse, Family IADL History Homemaking Responsibilities: Yes Meal Prep Responsibility: Secondary Laundry Responsibility: Secondary Cleaning Responsibility: Secondary Bill Paying/Finance Responsibility: Secondary Shopping Responsibility: Secondary Child Care Responsibility: Secondary Occupation: On disability Prior Function Level of Independence: Independent with gait, Independent with homemaking with ambulation  Able to Take Stairs?: Yes Driving: Yes Vocation: On disability Leisure: Hobbies-yes (Comment) Comments: used to enjoy playing golf, going outside and go for long walks   Vision Baseline Vision/History: Wears glasses Wears Glasses: Reading only Patient Visual Report: No change from baseline Vision Assessment?: No apparent visual deficits Perception  Perception: Within Functional Limits Praxis Praxis: Intact Cognition Overall Cognitive Status: Within Functional Limits for tasks assessed Arousal/Alertness: Awake/alert Orientation Level: Person;Place;Situation Person: Oriented Place: Oriented Situation: Oriented Year: 2020 Month: April Day of Week: Correct Memory: Impaired Memory Impairment: Decreased short term memory Immediate Memory Recall: Sock;Blue;Bed Memory Recall: Blue;Bed Memory Recall Blue: With Cue Memory Recall Bed: Without Cue Attention: Sustained Focused Attention: Appears intact Sustained Attention: Appears intact Awareness: Appears intact Problem  Solving: Appears intact Safety/Judgment: Appears intact Sensation Sensation Light Touch: Appears Intact Hot/Cold: Appears Intact Coordination Gross Motor Movements are Fluid and Coordinated: Yes Fine Motor Movements are Fluid and Coordinated: No Coordination and Movement Description: impaired motor coordination secondary to muscular weakness and muscular dystrophy Finger Nose Finger Test: [facility] 9 Hole Peg Test: L: 26.38 sec. R: 24.75 Motor  Motor Motor: Other (comment) Motor - Skilled Clinical Observations: B LE (L>R) weakness and generalized weakness Mobility  Bed Mobility Bed Mobility: Rolling Right;Supine to  Sit;Sit to Supine Rolling Right: Supervision/verbal cueing Supine to Sit: Minimal Assistance - Patient > 75% Sit to Supine: Supervision/Verbal cueing Transfers Sit to Stand: Moderate Assistance - Patient 50-74% Stand to Sit: Moderate Assistance - Patient 50-74%  Trunk/Postural Assessment  Cervical Assessment Cervical Assessment: Exceptions to WFL(forward head) Thoracic Assessment Thoracic Assessment: Exceptions to WFL(rounded shoulders) Lumbar Assessment Lumbar Assessment: Exceptions to WFL(posterior pelvic tilt) Postural Control Postural Control: Deficits on evaluation Righting Reactions: delayed Protective Responses: impaired Postural Limitations: impaired  Balance Balance Balance Assessed: Yes Static Sitting Balance Static Sitting - Balance Support: Feet supported Static Sitting - Level of Assistance: 5: Stand by assistance Dynamic Sitting Balance Dynamic Sitting - Balance Support: Feet supported;During functional activity Dynamic Sitting - Level of Assistance: 4: Min assist Dynamic Sitting - Balance Activities: Lateral lean/weight shifting Sitting balance - Comments: pt with posterior/lateral leans Static Standing Balance Static Standing - Balance Support: During functional activity;Bilateral upper extremity supported Static Standing - Level of Assistance:  4: Min assist Dynamic Standing Balance Dynamic Standing - Balance Support: During functional activity;Bilateral upper extremity supported Dynamic Standing - Level of Assistance: 3: Mod assist Dynamic Standing - Balance Activities: Reaching for objects Extremity/Trunk Assessment RUE Assessment RUE Assessment: Exceptions to [facility] RUE Strength Right Hand Grip (lbs): 11 lbs LUE Assessment LUE Assessment: Exceptions to [facility] LUE Strength Left Hand Grip (lbs): 12 lbs     Refer to Care Plan for Long Term Goals  Recommendations for other services: Neuropsych   Discharge Criteria: Patient will be discharged from OT if patient refuses treatment 3 consecutive times without medical reason, if treatment goals not met, if there is a change in medical status, if patient makes no progress towards goals or if patient is discharged from hospital.  The above assessment, treatment plan, treatment alternatives and goals were discussed and mutually agreed upon: by patient  Curtis Sites 08/16/2018, 2:37 PM

## 2018-08-16 NOTE — Progress Notes (Addendum)
Physical Therapy Session Note  Patient Details  Name: Luis Choi MRN: [id number] Date of Birth: 1966-04-02  Today's Date: 08/16/2018 PT Individual Time: 1660-6004 PT Individual Time Calculation (min): 29 min    Skilled Therapeutic Interventions/Progress Updates:  Pt received in bed & agreeable to tx. Pt denied c/o pain. Pt transferred supine<>sitting EOB with HOB elevated & bed rails and supervision. Pt dons slippers with supervision. Pt transfers sit<>stand throughout session from EOB and w/c with min assist with cuing for hand placement and RW. Pt transfers bed<>w/c via ambulation with min assist and RW. In gym pt negotiates 4 steps with B rails and min assist with cuing for compensatory pattern with slight L knee buckling noted at top of stairs and pt reporting increased difficulty when descending stairs vs ascending. Pt ambulates 40 ft with RW & min assist with B knees, elbows, and neck flexed with cuing to look up with pt demonstrating difficulty uprighting posture 2/2 weakness. At end of session pt left in bed with alarm set & needs at hand.  Pt on room air during session and pt's SpO2 dropped to 88% twice but quickly returned to >90%, RN made aware. Therapist educates pt throughout session on holding pressure over stoma.   Therapy Documentation Precautions:  Precautions Precautions: Fall Restrictions Weight Bearing Restrictions: No   Therapy/Group: Individual Therapy  Sandi Mariscal 08/16/2018, 3:29 PM

## 2018-08-16 NOTE — Evaluation (Signed)
Physical Therapy Assessment and Plan  Patient Details  Name: Luis Choi MRN: [id number] Date of Birth: [birth year]  PT Diagnosis: Abnormality of gait, Difficulty walking and Muscle weakness Rehab Potential: Good ELOS: 2-2.5 weeks   Today's Date: 08/16/2018 PT Individual Time: 4098-1191 PT Individual Time Calculation (min): 63 min    Problem List:  Patient Active Problem List   Diagnosis Date Noted  . Debility 08/15/2018  . Acute on chronic respiratory failure with hypoxia ([place])   . Chronic atrial fibrillation   . Pneumonia of both lungs due to Pseudomonas species ([place])   . Pleural effusion, left   . Muscular dystrophy [facility])     Past Medical History:  Past Medical History:  Diagnosis Date  . Acute on chronic respiratory failure with hypoxia ([place])   . Chronic atrial fibrillation   . Hepatitis A   . Muscular dystrophy ([place])   . Pleural effusion, left   . Pneumonia of both lungs due to Pseudomonas species [facility])    Past Surgical History:  Past Surgical History:  Procedure Laterality Date  . CATARACT EXTRACTION, BILATERAL  2018   with implants  . CHOLECYSTECTOMY  2010   CBD cyst with enlarged duct/cyst  . COLONOSCOPY W/ POLYPECTOMY    . PANCREATIC CYST DRAINAGE  2010   due trauma  . THYROIDECTOMY, PARTIAL Right 12/2009    Assessment & Plan Clinical Impression: Patient is a 53 y.o. year old male with history of myotonic muscular dystrophy, PAF, hypothyroidism who was admitted to OSH with A. fib with RVR, 2 to 3 months of DOE/orthopnea and required intubation. He was cardioverted and started onamiodarone for heart rate controland treated with sotalol for episode ofSVT. He failed attempts at extubation therefore required tracheostomy on 05/29/18. He was found to have multidrug-resistant Pseudomonas as well as suspicious mass in the chest treated with Zerbaxa.Hospital course significant for eosinophilia as possible side effect of Zyprexa, left chest hemothorax due to  ASA ,anemia requiring transfusion, dysphagia --PEG tube placed on 06/03/18 andmild addisonian crisis requiring stress dose steroids. He was transferred to [facility] on 06/24/2018 for vent weaning. Repeat sputum cultures showed ESBL pseudomonas and Klebsiella and antibiotics changed to Gentamicin/cipro. CT chest done revealing moderate to large bilateral pleural effusions with subtotal collapse of lower lobes and 3 cm X 1.3 cm X 1.5 cm mass in RML worrisome for lung CA and 1.2 cm mass in RUL- inflammatory v/s neoplastic. He underwent thoracocentesis of 1 L amber fluid on 02/25 and pathology negative for malignancy. He has been weaned off vent-->BIPAP and decannulated by 04/10. Has been tolerating dysphagia 3, thins without difficulty. Respiratory status has improved and he is showing improvement in activity tolerance but noted to be deconditioned. CIR recommended due to functional decline. Patient transferred to CIR on 08/15/2018 .   Patient currently requires mod with mobility secondary to muscle weakness, decreased cardiorespiratoy endurance, impaired timing and sequencing and unbalanced muscle activation and decreased sitting balance, decreased standing balance, decreased postural control and decreased balance strategies.  Prior to hospitalization, patient was independent  with mobility and lived with Spouse, Family(wife and 2 10y.o. twins) in a House home.  Home access is 2 from garageStairs to enter.  Patient will benefit from skilled PT intervention to maximize safe functional mobility, minimize fall risk and decrease caregiver burden for planned discharge home with 24 hour supervision.  Anticipate patient will benefit from follow up [place] at discharge.  PT - End of Session Activity Tolerance: Tolerates 30+ min activity with multiple  rests Endurance Deficit: Yes Endurance Deficit Description: decreased PT Assessment Rehab Potential (ACUTE/IP ONLY): Good PT Barriers to Discharge: Inaccessible home  environment;Decreased caregiver support;Home environment access/layout;Lack of/limited family support PT Patient demonstrates impairments in the following area(s): Balance;Endurance;Motor;Sensory;Nutrition PT Transfers Functional Problem(s): Bed Mobility;Bed to Chair;Car;Furniture PT Locomotion Functional Problem(s): Ambulation;Wheelchair Mobility;Stairs PT Plan PT Intensity: Minimum of 1-2 x/day ,45 to 90 minutes PT Frequency: 5 out of 7 days PT Duration Estimated Length of Stay: 2-2.5 weeks PT Treatment/Interventions: Ambulation/gait training;Discharge planning;DME/adaptive equipment instruction;Functional mobility training;Pain management;Psychosocial support;Splinting/orthotics;Therapeutic Activities;UE/LE Strength taining/ROM;Balance/vestibular training;Community reintegration;Disease management/prevention;Functional electrical stimulation;Neuromuscular re-education;Patient/family education;Stair training;Therapeutic Exercise;UE/LE Coordination activities;Wheelchair propulsion/positioning PT Transfers Anticipated Outcome(s): supervision for bed<>chair transfers PT Locomotion Anticipated Outcome(s): supervision ambulation with LRAD PT Recommendation Recommendations for Other Services: Neuropsych consult;Therapeutic Recreation consult Therapeutic Recreation Interventions: Pet therapy;Stress management;Outing/community reintergration Follow Up Recommendations: Home health PT;24 hour supervision/assistance Patient destination: Home Equipment Recommended: To be determined  Skilled Therapeutic Intervention Evaluation completed (see details above and below) with education on PT POC and goals and individual treatment initiated with focus on bed mobility, transfers, and ambulation. Pt received supine in bed with nursing staff present. Pt performed supine to sit, HOB flat and no bedrails, with min assist for trunk upright and increased time to perform mobility task. Pt performed sit to supine, HOB flat  and no bedrails, with close supervision for safety. Pt performed sitting EOB LB dressing with min assist for looping LE into clothing and intermittent min assist for sitting balance - completed dressing in standing with mod assist to pull pants over hips. Pt performed sit to stand from elevated EOB to RW with min assist for lifting. Therapist adjusted height of RW for improved patient fit and provided w/c with cushion to allow for improved postural alignment and tolerance to upright sitting. Pt performed stand pivot transfer EOB to w/c using RW with min assist for balance and cuing for sequencing. Pt ambulated ~136f using RW with min assist for balance and w/c follow due to increased fatigue. Pt reported needing to use bathroom and was transported in w/c back to room. Pt ambulated ~163finto bathroom using RW and min assist for balance. Performed sit<>stand from toilet using grab bars with mod assist due to lower height of toilet. Pt continent of bowels and performed seated peri-care with supervision for sitting balance. Pt ambulated ~1554fo bed using RW with min assist for balance. Pt performed sit to supine with supervision for safety and left supine in bed with needs in reach and bed alarm on. Patient demonstrates significant impairment of generalized weakness and decreased activity tolerance.  PT Evaluation Precautions/Restrictions Precautions Precautions: Fall Restrictions Weight Bearing Restrictions: No Other Position/Activity Restrictions: PEG tube Pain Pain Assessment Pain Scale: 0-10 Pain Score: 0-No pain Pain Intervention(s): Other (Comment);Rest(therapy to tolerance) Home Living/Prior Functioning Home Living Available Help at Discharge: Family;Available PRN/intermittently Type of Home: House Home Access: Stairs to enter [employer] Steps: 2 from garage Entrance Stairs-Rails: None Home Layout: One level Bathroom Toilet: Handicapped height  Lives With:  Spouse;Family(wife and 2 10y.o. twins) Prior Function Level of Independence: Independent with gait;Independent with homemaking with ambulation  Able to Take Stairs?: Yes Driving: Yes Vocation: On disability Leisure: Hobbies-yes (Comment) Comments: used to enjoy playing golf, going outside and go for long walks Vision/Perception  Perception Perception: Within Functional Limits Praxis Praxis: Intact  Cognition Overall Cognitive Status: Within Functional Limits for tasks assessed Arousal/Alertness: Awake/alert Orientation Level: Oriented X4 Attention: Focused;Sustained Focused Attention: Appears intact Sustained Attention: Appears intact Memory:  Appears intact Awareness: Appears intact Safety/Judgment: Appears intact Sensation Sensation Light Touch: Appears Intact Coordination Gross Motor Movements are Fluid and Coordinated: No Coordination and Movement Description: impaired motor coordination secondary to muscular weakness Motor  Motor Motor: Other (comment) Motor - Skilled Clinical Observations: B LE (L>R) weakness  Mobility Bed Mobility Bed Mobility: Rolling Right;Supine to Sit;Sit to Supine Rolling Right: Supervision/verbal cueing(using bed features) Supine to Sit: Minimal Assistance - Patient > 75% Sit to Supine: Supervision/Verbal cueing Transfers Transfers: Sit to Stand;Stand to Sit;Stand Pivot Transfers Sit to Stand: Moderate Assistance - Patient 50-74% Stand to Sit: Moderate Assistance - Patient 50-74% Stand Pivot Transfers: Minimal Assistance - Patient > 75% Stand Pivot Transfer Details: Verbal cues for sequencing;Verbal cues for technique;Visual cues/gestures for sequencing Transfer (Assistive device): Rolling walker Locomotion  Gait Ambulation: Yes Gait Assistance: Minimal Assistance - Patient > 75% Gait Distance (Feet): 125 Feet Assistive device: Rolling walker Gait Assistance Details: Verbal cues for sequencing;Verbal cues for technique;Verbal cues for  safe use of DME/AE;Verbal cues for gait pattern Gait Gait: Yes Gait Pattern: Impaired Gait Pattern: Decreased step length - right;Decreased step length - left;Decreased stance time - right;Decreased stance time - left;Decreased stride length;Narrow base of support;Poor foot clearance - left;Poor foot clearance - right Gait velocity: decreased Stairs / Additional Locomotion Stairs: No Wheelchair Mobility Wheelchair Mobility: No  Trunk/Postural Assessment  Cervical Assessment Cervical Assessment: Exceptions to WFL(forward head) Thoracic Assessment Thoracic Assessment: Exceptions to WFL(thoracic kyphosis) Lumbar Assessment Lumbar Assessment: Exceptions to WFL(posterior pelvic tilt) Postural Control Postural Control: Deficits on evaluation Righting Reactions: delayed Protective Responses: impaired Postural Limitations: impaired  Balance Balance Balance Assessed: Yes Static Sitting Balance Static Sitting - Balance Support: Feet supported Static Sitting - Level of Assistance: 4: Min assist;5: Stand by assistance Dynamic Sitting Balance Dynamic Sitting - Balance Support: Feet supported;During functional activity Dynamic Sitting - Level of Assistance: 4: Min assist Static Standing Balance Static Standing - Balance Support: During functional activity;Bilateral upper extremity supported Static Standing - Level of Assistance: 4: Min assist Dynamic Standing Balance Dynamic Standing - Balance Support: During functional activity;Bilateral upper extremity supported Dynamic Standing - Level of Assistance: 4: Min assist;3: Mod assist Extremity Assessment      RLE Assessment RLE Assessment: Exceptions to [facility] RLE Strength Right Hip Flexion: 4/5 Right Knee Flexion: 4-/5 Right Knee Extension: 4+/5 Right Ankle Dorsiflexion: 4/5 Right Ankle Plantar Flexion: 4-/5 LLE Assessment LLE Assessment: Exceptions to [facility] LLE Strength Left Hip Flexion: 4-/5 Left Knee Flexion: 3+/5 Left Knee  Extension: 4/5 Left Ankle Dorsiflexion: 3+/5 Left Ankle Plantar Flexion: 3+/5    Refer to Care Plan for Long Term Goals  Recommendations for other services: Neuropsych and Therapeutic Recreation  Pet therapy, Stress management and Outing/community reintegration  Discharge Criteria: Patient will be discharged from PT if patient refuses treatment 3 consecutive times without medical reason, if treatment goals not met, if there is a change in medical status, if patient makes no progress towards goals or if patient is discharged from hospital.  The above assessment, treatment plan, treatment alternatives and goals were discussed and mutually agreed upon: by patient  Tawana Scale, PT, DPT 08/16/2018, 9:51 AM

## 2018-08-16 NOTE — Evaluation (Signed)
Speech Language Pathology Assessment and Plan  Patient Details  Name: Luis Choi MRN: [id number] Date of Birth: 07-19-1965  SLP Diagnosis: Dysphagia;Dysarthria;Cognitive Impairments  Rehab Potential: Excellent ELOS: 2 weeks     Today's Date: 08/16/2018 SLP Individual Time: 1100-1155 SLP Individual Time Calculation (min): 55 min   Problem List:  Patient Active Problem List   Diagnosis Date Noted  . Debility 08/15/2018  . Acute on chronic respiratory failure with hypoxia ([place])   . Chronic atrial fibrillation   . Pneumonia of both lungs due to Pseudomonas species ([place])   . Pleural effusion, left   . Muscular dystrophy [facility])    Past Medical History:  Past Medical History:  Diagnosis Date  . Acute on chronic respiratory failure with hypoxia ([place])   . Chronic atrial fibrillation   . Hepatitis A   . Muscular dystrophy ([place])   . Pleural effusion, left   . Pneumonia of both lungs due to Pseudomonas species [facility])    Past Surgical History:  Past Surgical History:  Procedure Laterality Date  . CATARACT EXTRACTION, BILATERAL  2018   with implants  . CHOLECYSTECTOMY  2010   CBD cyst with enlarged duct/cyst  . COLONOSCOPY W/ POLYPECTOMY    . PANCREATIC CYST DRAINAGE  2010   due trauma  . THYROIDECTOMY, PARTIAL Right 12/2009    Assessment / Plan / Recommendation Clinical Impression Patient is a6 year old male with history of myotonic muscular dystrophy, PAF, hypothyroidism who was admitted to OSH with A. fib with RVR, 2 to 3 months of DOE/orthopnea and required intubation. He was cardioverted and started onamiodarone for heart rate controland treated with sotalol for episode ofSVT. He failed attempts at extubation therefore required tracheostomy on 05/29/18. He was found to have multidrug-resistant Pseudomonas as well as suspicious mass in the chest treated with Zerbaxa.Hospital course significant for eosinophilia as possible side effect of Zyprexa, left chest  hemothorax due to ASA ,anemia requiring transfusion, dysphagia --PEG tube placed on 06/03/18 andmild addisonian crisis requiring stress dose steroids. He was transferred to [facility] on 06/24/2018 for vent weaning. Repeat sputum cultures showed ESBL pseudomonas and Klebsiella and antibiotics changed to Gentamicin/cipro. CT chest done revealing moderate to large bilateral pleural effusions with subtotal collapse of lower lobes and 3 cm X 1.3 cm X 1.5 cm mass in RML worrisome for lung CA and 1.2 cm mass in RUL- inflammatory v/s neoplastic. He underwent thoracocentesis of 1 L amber fluid on 02/25 and pathology negative for malignancy. He has been weaned off vent-->BIPAP and decannulated by 04/10. Has been tolerating dysphagia 3, thins without difficulty. Respiratory status has improved and he is showing improvement in activity tolerance but noted to be deconditioned. CIR recommended due to functional decline and patient admitted 08/15/18.  Patient demonstrates mild higher-level impairments impacting complex problem solving, short-term recall and emergent awareness which impacts his safety with functional tasks. Patient also demonstrates decreased speech intelligibility due to a wet vocal quality, air leakage from stoma due to patient not applying pressure despite Max A verbal cues and an increased speech rate which SLP suspects is baseline. Patient consumed thin liquids via straw without overt s/s of aspiration but required liquid washes and multiple swallows to clear trials of regular textures. Recommend patient continue current diet of Dys. 3 textures with thin liquids. Patient would benefit from skilled SLP intervention to maximize his cognitive, swallowing and speech function prior to discharge.      Skilled Therapeutic Interventions          Administered  a cognitive-linguistic evaluation and BSE, please see above for details. Educated patient in regards to current cognitive, swallowing and communication deficits  and goals of skilled SLP intervention, he verbalized understanding and agreement.   SLP Assessment  Patient will need skilled Speech Lanaguage Pathology Services during CIR admission    Recommendations  SLP Diet Recommendations: Dysphagia 3 (Mech soft);Thin Liquid Administration via: Straw Medication Administration: Whole meds with liquid Supervision: Patient able to self feed;Full supervision/cueing for compensatory strategies Compensations: Minimize environmental distractions;Slow rate;Small sips/bites Postural Changes and/or Swallow Maneuvers: Seated upright 90 degrees Oral Care Recommendations: Oral care BID Recommendations for Other Services: Neuropsych consult Patient destination: Home Follow up Recommendations: None Equipment Recommended: None recommended by SLP    SLP Frequency 3 to 5 out of 7 days   SLP Duration  SLP Intensity  SLP Treatment/Interventions 2 weeks   Minumum of 1-2 x/day, 30 to 90 minutes  Cognitive remediation/compensation;Environmental controls;Internal/external aids;Speech/Language facilitation;Therapeutic Activities;Patient/family education;Functional tasks;Dysphagia/aspiration precaution training;Cueing hierarchy    Pain Pain Assessment Pain Scale: 0-10 Pain Score: 0-No pain  Prior Functioning Type of Home: House  Lives With: Spouse;Family Available Help at Discharge: Family;Available PRN/intermittently Vocation: On disability  Short Term Goals: Week 1: SLP Short Term Goal 1 (Week 1): Patient will demonstrate efficient mastication of regular textures with complete oral clearance and without overt s/s of aspiration over 2 sessions with Mod I prior to upgrade.  SLP Short Term Goal 2 (Week 1): Patient will improve speech intelligibility to 100% at the sentence level with supervision verbal cues for use of speech intelligibility strategies.  SLP Short Term Goal 3 (Week 1): Patient will demonstrate complex problem solving with functional tasks with  Mod I.  SLP Short Term Goal 4 (Week 1): Patient will recall new, daily information with Mod I.  SLP Short Term Goal 5 (Week 1): Patient will self-monitor and correct errors during functional tasks with Mod I.   Refer to Care Plan for Long Term Goals  Recommendations for other services: Neuropsych  Discharge Criteria: Patient will be discharged from SLP if patient refuses treatment 3 consecutive times without medical reason, if treatment goals not met, if there is a change in medical status, if patient makes no progress towards goals or if patient is discharged from hospital.  The above assessment, treatment plan, treatment alternatives and goals were discussed and mutually agreed upon: by patient  Ferlando Lia 08/16/2018, 3:12 PM

## 2018-08-16 NOTE — Progress Notes (Signed)
Inpatient Rehabilitation  Patient information reviewed and entered into eRehab system by Jashaun Penrose M. Minyon Billiter, M.A., CCC/SLP, PPS Coordinator.  Information including medical coding, functional ability and quality indicators will be reviewed and updated through discharge.    

## 2018-08-16 NOTE — Progress Notes (Signed)
[place] PHYSICAL MEDICINE & REHABILITATION PROGRESS NOTE   Subjective/Complaints: Had a reasonable night. Anxious to get cleaned up this morning. Denies any new issues  ROS: Patient denies fever, rash, sore throat, blurred vision, nausea, vomiting, diarrhea, cough, shortness of breath or chest pain, joint or back pain, headache, or mood change.    Objective:   No results found. Recent Labs    08/16/18 0510  WBC 9.9  HGB 10.3*  HCT 34.7*  PLT 341   Recent Labs    08/16/18 0510  NA 142  K 3.9  CL 104  CO2 28  GLUCOSE 109*  BUN 22*  CREATININE 0.74  CALCIUM 9.1    Intake/Output Summary (Last 24 hours) at 08/16/2018 0900 Last data filed at 08/16/2018 0736 Gross per 24 hour  Intake 240 ml  Output 575 ml  Net -335 ml     Physical Exam: Vital Signs Blood pressure 95/77, pulse 95, temperature 98.2 F (36.8 C), temperature source Oral, resp. rate 18, height 6\' 3"  (1.905 m), weight 86.4 kg, SpO2 95 %.  Constitutional: No distress . Vital signs reviewed. HEENT: EOMI, oral membranes moist Neck: supple, trach stoma closing, still with air leak Cardiovascular: RRR without murmur. No JVD    Respiratory: CTA Bilaterally without wheezes or rales. Normal effort , [place] GI: BS +, non-tender, non-distended, PEG site dry Musculoskeletal:  General: No edema.  Neurological: He isalertand oriented to person, place, and time. Speech dysphonic, better with stoma occlusion. Reasonable insight and awareness. Normal language, follows all commands. UE 4-5/5 prox to distal except for 3/5 strength in HI right more preserved than left. LE: 3-4/5 prox to distal. Motor stable Skin: Skin iswarmand dry.  Psychiatric: pleasant   Assessment/Plan: 1. Functional deficits secondary to debility after multiple medical which require 3+ hours per day of interdisciplinary therapy in a comprehensive inpatient rehab setting.  Physiatrist is providing close team supervision and 24 hour  management of active medical problems listed below.  Physiatrist and rehab team continue to assess barriers to discharge/monitor patient progress toward functional and medical goals  Care Tool:  Bathing              Bathing assist       Upper Body Dressing/Undressing Upper body dressing        Upper body assist      Lower Body Dressing/Undressing Lower body dressing            Lower body assist       Toileting Toileting    Toileting assist Assist for toileting: Independent with assistive device Assistive Device Comment: (bed pan)   Transfers Chair/bed transfer  Transfers assist           Locomotion Ambulation   Ambulation assist              Walk 10 feet activity   Assist           Walk 50 feet activity   Assist           Walk 150 feet activity   Assist           Walk 10 feet on uneven surface  activity   Assist           Wheelchair     Assist               Wheelchair 50 feet with 2 turns activity    Assist            Wheelchair  150 feet activity     Assist          Medical Problem List and Plan: 1.Functional and mobility deficitssecondary to respiratory failure and multiple medical issues -Patient is beginning CIR therapies today including PT and OT, SLP 2. Antithrombotics: -DVT/anticoagulation: lovenox 40mg  q12             -dopplers pending -antiplatelet therapy: asa 325mg  3. Pain Management: tylenol and hydrocodone 4. Mood:  -antipsychotic agents: n/a             -trazodone 25-50mg  qhs for sleep 5. Neuropsych: This patientiscapable of making decisions on hisown behalf. 6. Skin/Wound Care: local care to trach/PEG sites to promote healing  -reiterated need to apply pressure to stoma to reduce air leakage             -maximize nutrition 7. Fluids/Electrolytes/Nutrition:              -D3 and thins diet currently  -ate 75%  breakfast today             -I personally reviewed the patient's labs today.    -BUN sl elevated---encourage fluids 8. Type 2 DM:  lantus 10u qhs, SSI as needed  -need to check cbg's 9. Pulmonary: decannulated, s/p thoracentesis---negative for malignancy             -oxygen therapy via [place] to keep sats >90%             -pt has not tolerated CPAP---revisit as outpt             -robitussin dm prn for cough 10. Atrial fibrillation: amiodarone for rate controlled 11. Anemia of chronic disease: hgb 10.3    LOS: 1 days A FACE TO FACE EVALUATION WAS PERFORMED  Ranelle OysterZachary T Nilo Fallin 08/16/2018, 9:00 AM

## 2018-08-16 NOTE — Progress Notes (Signed)
Initial Nutrition Assessment  RD working remotely.  DOCUMENTATION CODES:   Not applicable  INTERVENTION:   - Ensure Enlive po BID, each supplement provides 350 kcal and 20 grams of protein  - MVI with minerals daily  NUTRITION DIAGNOSIS:   Increased nutrient needs related to other (therapies) as evidenced by estimated needs.  GOAL:   Patient will meet greater than or equal to 90% of their needs  MONITOR:   PO intake, Supplement acceptance, Labs, Weight trends, Diet advancement  REASON FOR ASSESSMENT:   Malnutrition Screening Tool    ASSESSMENT:   53 year old male with PMH of myotonic muscular dystrophy, PAF, hypothyroidism who was admitted to OSH with A. fib with RVR and 2 to 3 months of DOE/orthopnea and required intubation. Pt was cardioverted and started on amiodarone for heart rate control and treated with sotalol for episode of SVT. Pt failed attempts at extubation therefore required trach on 01/26. Pt was found to have multidrug-resistant Pseudomonas as well as suspicious mass in the chest treated with Zerbaxa. Hospital course significant for eosinophilia as possible side effect of Zyprexa, left chest hemothorax due to ASA, anemia requiring transfusion, dysphagia. PEG tube placed on 01/31. Pt was transferred to [facility] on 2/21 for vent weaning. CT chest done revealing moderate to large bilateral pleural effusions with subtotal collapse of lower lobes and mass worrisome for lung cancer. Pt underwent thoracocentesis of 1 L amber fluid on 2/25 and pathology negative for malignancy. Pt has been weaned off vent and decannulated by 4/10. Pt has been tolerating dysphagia 3, thins without difficulty. Admitted to CIR on 4/13.  RD called pt's room phone during a break in therapies per pt's schedule but pt did not answer. No weight history from PTA available in pt's chart.  RD to order oral nutrition supplement to aid in meeting pt's kcal and protein needs. Will also order daily MVI with  minerals.  RD will call back at a later date in an attempt to obtain diet and weight history.  Meal Completion: 75% x 1 meal  Medications reviewed and include: Lantus 10 units daily, vitamin C 500 mg daily  Labs reviewed: elevated LFTs CBG: 115  NUTRITION - FOCUSED PHYSICAL EXAM:  Unable to complete at this time. RD working remotely.  Diet Order:   Diet Order            DIET DYS 3 Room service appropriate? Yes; Fluid consistency: Thin  Diet effective now              EDUCATION NEEDS:   No education needs have been identified at this time  Skin:  Skin Assessment: Reviewed RN Assessment  Last BM:  08/16/18 type 3  Height:   Ht Readings from Last 1 Encounters:  08/15/18 6\' 3"  (1.905 m)    Weight:   Wt Readings from Last 1 Encounters:  08/15/18 86.4 kg    Ideal Body Weight:  89.1 kg  BMI:  Body mass index is 23.81 kg/m.  Estimated Nutritional Needs:   Kcal:  2200-2400  Protein:  110-125 grams  Fluid:  >/= 2.0 L    Earma Reading, MS, RD, LDN Inpatient Clinical Dietitian Pager: [phone] Weekend/After Hours: [phone]

## 2018-08-17 ENCOUNTER — Inpatient Hospital Stay (HOSPITAL_COMMUNITY): Payer: BLUE CROSS/BLUE SHIELD

## 2018-08-17 ENCOUNTER — Inpatient Hospital Stay (HOSPITAL_COMMUNITY): Payer: Self-pay | Admitting: Physical Therapy

## 2018-08-17 ENCOUNTER — Inpatient Hospital Stay (HOSPITAL_COMMUNITY): Payer: BLUE CROSS/BLUE SHIELD | Admitting: Speech Pathology

## 2018-08-17 DIAGNOSIS — M7989 Other specified soft tissue disorders: Secondary | ICD-10-CM

## 2018-08-17 LAB — GLUCOSE, CAPILLARY
Glucose-Capillary: 130 mg/dL — ABNORMAL HIGH (ref 70–99)
Glucose-Capillary: 114 mg/dL — ABNORMAL HIGH (ref 70–99)
Glucose-Capillary: 116 mg/dL — ABNORMAL HIGH (ref 70–99)
Glucose-Capillary: 121 mg/dL — ABNORMAL HIGH (ref 70–99)

## 2018-08-17 NOTE — Progress Notes (Signed)
Occupational Therapy Session Note  Patient Details  Name: Luis Choi MRN: [id number] Date of Birth: 08-17-1965  Today's Date: 08/17/2018 OT Individual Time: 0815-0900 Session 2: 3202-3343 OT Individual Time Calculation (min): 45 min Session 2: 56 min   Short Term Goals: Week 1:  OT Short Term Goal 1 (Week 1): Pt will maintain SpO2 > 94 on RA during 1 standing level grooming task OT Short Term Goal 2 (Week 1): Pt will don pants using LRAD with CGA OT Short Term Goal 3 (Week 1): Pt will don B socks with LRAD with CGA OT Short Term Goal 4 (Week 1): Pt will transfer to comfort height toilet with CGA   Skilled Therapeutic Interventions/Progress Updates:    Session focused on toileting tasks, cardiorespiratory endurance, and BUE strengthening. Pt received supine in bed, with nursing present getting ready to help pt to bathroom. Pt transitioned to sitting EOB with (S), use of bed rails. Min cueing for pt to place UE on bed during sit > stand, min A for lifting assistance. Pt used RW to complete functional mobility into bathroom with min A. Pt transferred onto toilet and voided bowel/bladder. Pt able to complete peri hygiene with CGA for balance support. Vitals were assessed while pt was on toilet, with desaturation of O2 to 83%. Pt encouraged to take several deep breaths with pressure on stoma and O2 rose to ~88% briefly. 2L [place] were administered and pt requested to return to bed d/t discomfort from sitting on toilet. Once back supine in bed, pt's SpO2 rose to 95%. Pt completed brief B UE strengthening/circuit with blue theraband. A BSC was obtained to place over pt's toilet to increase ease with transfers d/t pt height. Pt was left supine with all needs met, bed alarm set.   Session 2: Pt received supine in bed with no c/o pain. Discussion with pt re safest DME to use in shower, with decision to use [facility] for now made. Pt's stoma and peg were covered to ensure waterproofing. Pt used RW to complete sit  > stand transfer, using momentum by rocking forward and min A to stabilize once transfer initiated. Pt completed functional mobility into bathroom and required cueing for safety d/t pt attempting to doff clothing in standing, almost losing balance. Pt transferred onto [facility] in walk in shower with min A, cueing for UE placement. Pt able to bath UB with (S). Min A for standing balance assist to bath LB. Following shower pt returned to EOB and donned underwear/shorts with min A, reacher used to thread R LE. Pt returned to supine and with cueing was able to scoot himself up in bed with min A. Skilled monitoring of vitals throughout session with no drop in SpO2 throughout, however pt tachy following shower, averaging ~125 bpm. Pt was left supine with all needs met, bed alarm set.   Therapy Documentation Precautions:  Precautions Precautions: Fall Restrictions Weight Bearing Restrictions: No Other Position/Activity Restrictions: PEG tube   Therapy/Group: Individual Therapy  Curtis Sites 08/17/2018, 7:07 AM

## 2018-08-17 NOTE — Progress Notes (Signed)
Physical Therapy Session Note  Patient Details  Name: Luis Choi MRN: [id number] Date of Birth: 1966-01-20  Today's Date: 08/17/2018 PT Individual Time: 1335-1430 PT Individual Time Calculation (min): 55 min   Short Term Goals: Week 1:  PT Short Term Goal 1 (Week 1): Pt will ambulate ~120ft using LRAD with no more than CGA PT Short Term Goal 2 (Week 1): Pt will perform sit<>stand using LRAD with no more than CGA PT Short Term Goal 3 (Week 1): Pt will performed bed<>chair transfer using LRAD with no more than CGA PT Short Term Goal 4 (Week 1): Pt will ascend/descend 4 steps using unilateral handrail with no more than min assist  Skilled Therapeutic Interventions/Progress Updates:  Pt received in bed & agreeable to tx. Pt utilizes urinal from bed level with independence. Pt transfers to EOB with supervision and hospital bed features. Pt dons slippers with set up assist and shorts with min assist for sit>stand and standing balance. Pt transfers bed<>w/c with RW & min assist for short distance ambulation. Pt propelled w/c room>gym with BUE & supervision with task focusing on BUE strengthening & cardiopulmonary endurance training. Pt transfers sit<>stand with min assist with cuing for hand placement and very poor eccentric lowering. Pt ambulates 30 ft + 40 ft with RW & min assist with decreased LLE foot clearance as distance increased and one note of L knee buckling but no LOB noted. Pt demonstrates great difficulty in holding head up while in standing, as pt with neck flexed. Pt with also more impaired balance when holding head up & only able to hold it up for seconds at a time. Pt utilized nu-step on level 1 x 10 minutes with BLE only with task focusing on BLE strengthening & endurance training with pt requiring 1 rest break halfway through. Pt propels w/c back to room with supervision. Pt returns to bed in same manner noted above & sit>supine with supervision. Pt left in bed with alarm set & needs  at hand.   Pt on room air during session & SpO2 WNL.  Encouraged pt to start sitting up in recliner more during the day.  Therapy Documentation Precautions:  Precautions Precautions: Fall Restrictions Weight Bearing Restrictions: No Other Position/Activity Restrictions: PEG tube  Pain: Denied c/o pain.  Therapy/Group: Individual Therapy  Sandi Mariscal 08/17/2018, 2:36 PM

## 2018-08-17 NOTE — Patient Care Conference (Signed)
Inpatient RehabilitationTeam Conference and Plan of Care Update Date: 08/16/2018   Time: 2:50 PM    Patient Name: Luis Choi      Medical Record Number: [id number]  Date of Birth: [birth year] Sex: Male         Room/Bed: 4W08C/4W08C-01 Payor Info: Payor: BLUE CROSS BLUE SHIELD / Plan: BCBS OTHER / Product Type: *No Product type* /    Admitting Diagnosis: debility  Admit Date/Time:  08/15/2018  2:49 PM Admission Comments: No comment available   Primary Diagnosis:  <principal problem not specified> Principal Problem: <principal problem not specified>  Patient Active Problem List   Diagnosis Date Noted  . Debility 08/15/2018  . Acute on chronic respiratory failure with hypoxia (HCC)   . Chronic atrial fibrillation   . Pneumonia of both lungs due to Pseudomonas species (HCC)   . Pleural effusion, left   . Muscular dystrophy [facility])     Expected Discharge Date: Expected Discharge Date: (2 weeks)  Team Members Present: Physician leading conference: Dr. Faith Rogue Social Worker Present: Amada Jupiter, LCSW Nurse Present: Chana Bode, RN PT Present: Aleda Grana, PT OT Present: Other (comment)(Sandra Earlene Plater, OT) SLP Present: Feliberto Gottron, SLP PPS Coordinator present : Fae Pippin     Current Status/Progress Goal Weekly Team Focus  Medical   admited for debility related to respiratory failure/pneumonia/multiple medical which required prolonged hospitlaization and trach, PEG  improve activity tolerance  nutrition/dysphagia, trach stoma care, pulmonary needs   Bowel/Bladder   Per patient LBM 4/11  maintain continence  assess toileting needs qshift and PRN   Swallow/Nutrition/ Hydration   Dys. 3 textures with thin liquids, Mod I  Mod I  trials of regular textures   ADL's   mod A ADL transfers, mod A ADLs without AD  (S) overall  dynamic standing balance, functional activity tolerance, ADL retraining   Mobility   min<>mod assist transfers, min assist short distance gait  with RW, min assist stairs with B rails  supervision overall  transfers, gait, stair negotiation, balance, endurance, bed mobility   Communication   Supervision-Min A   Mod I  applying pressure to stoma, decrease speech rate   Safety/Cognition/ Behavioral Observations  Min A  Mod I  complex problem solving, recall    Pain   no c/o pain   continue to be free of pain  assess pain qshift and PRN   Skin   MASD to perineum and bottom barrier cream qshift and PRN trach ostomy dry occlusive dressing intact.  keep area clean dry promote proper wound healing.  assess skin qshift and PRN.    Rehab Goals Patient on target to meet rehab goals: Yes *See Care Plan and progress notes for long and short-term goals.     Barriers to Discharge  Current Status/Progress Possible Resolutions Date Resolved   Physician    Medical stability        ongoing medical mgt per medical chart      Nursing                  PT  Inaccessible home environment;Decreased caregiver support;Home environment access/layout;Lack of/limited family support  steps to enter house without rails, unsure if pt's wife can provide 24 hr supervision              OT                  SLP  SW                Discharge Planning/Teaching Needs:  pt to return home with his wife who can take some time from work.  She works days.    Teaching needs TBD   Team Discussion:  Multiple med issues;  pulm issues and MD. Janina Mayorach out;  Peg still in place but pt tolerating full diet and MD anticipates being able to remove prior to d/c.  Cont b/b.  Mod assist overall with therapies with supervision goals.  ST notes doing well with D3, thin diet.  Difficult to assess cognition due to speech production.  Revisions to Treatment Plan:  NA    Continued Need for Acute Rehabilitation Level of Care: The patient requires daily medical management by a physician with specialized training in physical medicine and rehabilitation for the following  conditions: Daily direction of a multidisciplinary physical rehabilitation program to ensure safe treatment while eliciting the highest outcome that is of practical value to the patient.: Yes Daily medical management of patient stability for increased activity during participation in an intensive rehabilitation regime.: Yes Daily analysis of laboratory values and/or radiology reports with any subsequent need for medication adjustment of medical intervention for : Post surgical problems;Nutritional problems;Pulmonary problems;Cardiac problems   I attest that I was present, lead the team conference, and concur with the assessment and plan of the team.   Amada JupiterHOYLE, Sanav Remer 08/17/2018, 2:21 PM   Team conference was held via web/ teleconference due to COVID - 19

## 2018-08-17 NOTE — Progress Notes (Signed)
[facility] PHYSICAL MEDICINE & REHABILITATION PROGRESS NOTE   Subjective/Complaints: No new complaints. Had a quiet evening. Tolerated therapies yesterday  ROS: Patient denies fever, rash, sore throat, blurred vision, nausea, vomiting, diarrhea, cough, shortness of breath or chest pain, joint or back pain, headache, or mood change.     Objective:   No results found. Recent Labs    08/16/18 0510  WBC 9.9  HGB 10.3*  HCT 34.7*  PLT 341   Recent Labs    08/16/18 0510  NA 142  K 3.9  CL 104  CO2 28  GLUCOSE 109*  BUN 22*  CREATININE 0.74  CALCIUM 9.1    Intake/Output Summary (Last 24 hours) at 08/17/2018 0814 Last data filed at 08/16/2018 1930 Gross per 24 hour  Intake 240 ml  Output 800 ml  Net -560 ml     Physical Exam: Vital Signs Blood pressure 110/74, pulse 90, temperature 98.6 F (37 C), temperature source Oral, resp. rate 17, height 6\' 3"  (1.905 m), weight 86.4 kg, SpO2 95 %.  Constitutional: No distress . Vital signs reviewed. HEENT: EOMI, oral membranes moist Neck: supple Cardiovascular: RRR without murmur. No JVD    Respiratory: CTA Bilaterally without wheezes or rales. Normal effort    GI: BS +, non-tender, non-distended  Musculoskeletal:  General: No edema.  Neurological: He isalertand oriented to person, place, and time. Speech dysphonic, needs cues to occlude stoma. fair insight and awareness. Normal language, follows all commands. UE 4-5/5 prox to distal except for 3/5 strength in HI right more preserved than left. LE: 3-4/5 prox to distal. Motor stable Skin: Skin iswarmand dry.  Psychiatric: pleasant   Assessment/Plan: 1. Functional deficits secondary to debility after multiple medical which require 3+ hours per day of interdisciplinary therapy in a comprehensive inpatient rehab setting.  Physiatrist is providing close team supervision and 24 hour management of active medical problems listed below.  Physiatrist and rehab team  continue to assess barriers to discharge/monitor patient progress toward functional and medical goals  Care Tool:  Bathing  Bathing activity did not occur: Safety/medical concerns           Bathing assist       Upper Body Dressing/Undressing Upper body dressing   What is the patient wearing?: Pull over shirt    Upper body assist Assist Level: Contact Guard/Touching assist    Lower Body Dressing/Undressing Lower body dressing      What is the patient wearing?: Underwear/pull up, Pants     Lower body assist Assist for lower body dressing: Moderate Assistance - Patient 50 - 74%     Toileting Toileting    Toileting assist Assist for toileting: Moderate Assistance - Patient 50 - 74% Assistive Device Comment: (bed pan)   Transfers Chair/bed transfer  Transfers assist     Chair/bed transfer assist level: Minimal Assistance - Patient > 75%     Locomotion Ambulation   Ambulation assist      Assist level: Minimal Assistance - Patient > 75% Assistive device: Walker-rolling Max distance: 40 ft    Walk 10 feet activity   Assist     Assist level: Minimal Assistance - Patient > 75% Assistive device: Walker-rolling   Walk 50 feet activity   Assist    Assist level: Minimal Assistance - Patient > 75% Assistive device: Walker-rolling    Walk 150 feet activity   Assist Walk 150 feet activity did not occur: Safety/medical concerns         Walk 10 feet  on uneven surface  activity   Assist Walk 10 feet on uneven surfaces activity did not occur: Safety/medical concerns         Wheelchair     Assist Will patient use wheelchair at discharge?: (TBD)             Wheelchair 50 feet with 2 turns activity    Assist            Wheelchair 150 feet activity     Assist          Medical Problem List and Plan: 1.Functional and mobility deficitssecondary to respiratory failure and multiple medical  issues --Continue CIR therapies including PT, OT, and SLP  2. Antithrombotics: -DVT/anticoagulation: lovenox 40mg  q12             -will order dopplers -antiplatelet therapy: asa 325mg  3. Pain Management: tylenol and hydrocodone 4. Mood:  -antipsychotic agents: n/a             -trazodone 25-50mg  qhs for sleep 5. Neuropsych: This patientiscapable of making decisions on hisown behalf. 6. Skin/Wound Care: local care to trach/PEG sites to promote healing  -reiterated need to apply pressure to stoma to reduce air leakage             -maximize nutrition 7. Fluids/Electrolytes/Nutrition:              -D3 and thins diet currently  -eating 100%              -BUN sl elevated---encouraging fluids. Recheck labs tomorrow 8. Type 2 DM:  lantus 10u qhs, SSI as needed  -need to check cbg's 9. Pulmonary: decannulated, s/p thoracentesis---negative for malignancy             -oxygen therapy via [place] to keep sats >90%             -pt has not tolerated CPAP---revisit as outpt             -robitussin dm prn for cough 10. Atrial fibrillation: amiodarone for rate controlled 11. Anemia of chronic disease: hgb 10.3    LOS: 2 days A FACE TO FACE EVALUATION WAS PERFORMED  Ranelle OysterZachary T Keaisha Sublette 08/17/2018, 8:14 AM

## 2018-08-17 NOTE — Progress Notes (Signed)
LE venous duplex       has been completed. Preliminary results can be found under CV proc through chart review. Cianna Kasparian, BS, RDMS, RVT   

## 2018-08-17 NOTE — Progress Notes (Signed)
Speech Language Pathology Daily Session Note  Patient Details  Name: Luis Choi MRN: [id number] Date of Birth: 06/19/1965  Today's Date: 08/17/2018 SLP Individual Time: 0900-0940 SLP Individual Time Calculation (min): 40 min  Short Term Goals: Week 1: SLP Short Term Goal 1 (Week 1): Patient will demonstrate efficient mastication of regular textures with complete oral clearance and without overt s/s of aspiration over 2 sessions with Mod I prior to upgrade.  SLP Short Term Goal 2 (Week 1): Patient will improve speech intelligibility to 100% at the sentence level with supervision verbal cues for use of speech intelligibility strategies.  SLP Short Term Goal 3 (Week 1): Patient will demonstrate complex problem solving with functional tasks with Mod I.  SLP Short Term Goal 4 (Week 1): Patient will recall new, daily information with Mod I.  SLP Short Term Goal 5 (Week 1): Patient will self-monitor and correct errors during functional tasks with Mod I.   Skilled Therapeutic Interventions: Skilled treatment session focused on cognitive goals. Patient was Mod I for basic problem solving with a money management task and required supervision verbal cues for recall of his current medications and their function. Patient independently verbalized appropriate organization for medication management at home. Patient with increased utilization of applying pressure to his stoma throughout session but required cues for increased force and placement. Wet vocal quality,  air leakage, and an increased speech rate continue to impact intelligibility. Patient left upright in bed with alarm on and all needs within reach. Continue with current plan of care.     Pain Pain Assessment Pain Scale: 0-10 Pain Score: 0-No pain  Therapy/Group: Individual Therapy  Briyana Badman 08/17/2018, 12:24 PM

## 2018-08-18 ENCOUNTER — Inpatient Hospital Stay (HOSPITAL_COMMUNITY): Payer: BLUE CROSS/BLUE SHIELD | Admitting: Speech Pathology

## 2018-08-18 ENCOUNTER — Inpatient Hospital Stay (HOSPITAL_COMMUNITY): Payer: BLUE CROSS/BLUE SHIELD

## 2018-08-18 LAB — BASIC METABOLIC PANEL
Anion gap: 12 (ref 5–15)
BUN: 19 mg/dL (ref 6–20)
CO2: 28 mmol/L (ref 22–32)
Calcium: 9.6 mg/dL (ref 8.9–10.3)
Chloride: 102 mmol/L (ref 98–111)
Creatinine, Ser: 0.71 mg/dL (ref 0.61–1.24)
GFR calc Af Amer: >60 mL/min (ref >60)
GFR calc non Af Amer: >60 mL/min (ref >60)
Glucose, Bld: 129 mg/dL — ABNORMAL HIGH (ref 70–99)
Potassium: 3.9 mmol/L (ref 3.5–5.1)
Sodium: 142 mmol/L (ref 135–145)

## 2018-08-18 LAB — GLUCOSE, CAPILLARY
Glucose-Capillary: 117 mg/dL — ABNORMAL HIGH (ref 70–99)
Glucose-Capillary: 127 mg/dL — ABNORMAL HIGH (ref 70–99)
Glucose-Capillary: 109 mg/dL — ABNORMAL HIGH (ref 70–99)
Glucose-Capillary: 112 mg/dL — ABNORMAL HIGH (ref 70–99)

## 2018-08-18 MED ORDER — INSULIN GLARGINE 100 UNIT/ML ~~LOC~~ SOLN
5.0000 [IU] | Freq: Every day | SUBCUTANEOUS | Status: DC
  Administered 2018-08-18 – 2018-08-22 (×5): 5 [IU] via SUBCUTANEOUS
  Filled 2018-08-18 (×6): qty 0.05

## 2018-08-18 NOTE — Progress Notes (Signed)
Physical Therapy Session Note  Patient Details  Name: Luis Choi MRN: [id number] Date of Birth: 20-Jul-1965  Today's Date: 08/18/2018 PT Individual Time: 0865-7846 PT Individual Time Calculation (min): 45 min   Short Term Goals: Week 1:  PT Short Term Goal 1 (Week 1): Pt will ambulate ~119ft using LRAD with no more than CGA PT Short Term Goal 2 (Week 1): Pt will perform sit<>stand using LRAD with no more than CGA PT Short Term Goal 3 (Week 1): Pt will performed bed<>chair transfer using LRAD with no more than CGA PT Short Term Goal 4 (Week 1): Pt will ascend/descend 4 steps using unilateral handrail with no more than min assist  Skilled Therapeutic Interventions/Progress Updates:     Patient in recliner on 1 L O2 upon PT arrival. Patient alert and agreeable to PT session.  Therapeutic Activity: Transfers: Patient performed sit to/from stand x10 with min A using a RW for strengthening. RPE 5/10 after each stand, provided 30 second sitting rest breaks in between each trial. Provided verbal cues for leaning forward to stand and sliding feet back before standing. Performed 8 trials with 1 L O2 with sats >90% and 2 trails without O2 with sats >90%  Gait Training:  Patient ambulated 40 feet using RW with CGA and w/c follow for safety and endurance. Ambulated with decreased gait speed, decreased step length, increased hip and knee flexion, forward trunk lean, and downward head gaze.Provided verbal cues for erect posture for increased breath support. O2 desaturated to 78% after ambulation without O2, saturations recovered to>90% on 2L O2 in sitting in <1 min.   Therapeutic Exercise: Patient performed the following exercises with verbal and tactile cues for proper technique. -Marching in standing with RW x on RA, O2 sats dropped to 89% x1, but recovered in standing with cues for pursed lip breathing.  Patient in recliner on 1 L O2, O2 sats 96%, at end of session with breaks locked, seat  belt alarm set, and all needs within reach. Patient educated on use of incentive spirometer, performing 5 trails, and energy conservation techniques with activity.    Therapy Documentation Precautions:  Precautions Precautions: Fall Restrictions Weight Bearing Restrictions: No Other Position/Activity Restrictions: PEG tube Pain:  Patient denied pain throughout session. Reported mild knee pain earlier this morning that had resolved.   Therapy/Group: Individual Therapy  Helayne Seminole, PT, DPT 08/18/2018, 1:14 PM

## 2018-08-18 NOTE — Progress Notes (Signed)
Occupational Therapy Session Note  Patient Details  Name: Luis Choi MRN: [id number] Date of Birth: Sep 08, 1965  Today's Date: 08/18/2018 OT Individual Time: 2952-8413 OT Individual Time Calculation (min): 72 min    Short Term Goals: Week 1:  OT Short Term Goal 1 (Week 1): Pt will maintain SpO2 > 94% on RA during 1 standing level grooming task OT Short Term Goal 2 (Week 1): Pt will don pants using LRAD with CGA OT Short Term Goal 3 (Week 1): Pt will don B socks with LRAD with CGA OT Short Term Goal 4 (Week 1): Pt will transfer to comfort height toilet with CGA   Skilled Therapeutic Interventions/Progress Updates:    1;1. Pt received in bed agreeable to shower. Ot covers stoma and peg with occlusive. Pt ambulates with MOD A for initial sit to stand with RW from EOB then steady A to transfer into BSC in shower. Pt requires S overall for bathing everything except peri area and buttocks at min A level for balance and VC for hand placement on grab bars. Pt dresses sit to stand level from w/c at sink with MIN A and S for shirt. Pt grooms seated at sink for energy conservation. Pt O2 >90% throughout session on RA with VC for covering stoma during deep breathing. Pt educated on DME options for showering at home and pt wanting shower chair. Pt ambulates from doorway to recliner in room for rest in between sessions with education on holding head hip to strengthen cervical muscles. Exited session with pt seated in recliner, belt alarm on and call light in reach  Therapy Documentation Precautions:  Precautions Precautions: Fall Restrictions Weight Bearing Restrictions: No Other Position/Activity Restrictions: PEG tube General:   Vital Signs:   Pain: Pain Assessment Pain Scale: 0-10 Pain Score: 0-No pain ADL:   Vision   Perception    Praxis   Exercises:   Other Treatments:     Therapy/Group: Individual Therapy  Shon Hale 08/18/2018, 9:59 AM

## 2018-08-18 NOTE — Progress Notes (Signed)
Speech Language Pathology Daily Session Note  Patient Details  Name: Luis Choi MRN: [id number] Date of Birth: 1965-05-29  Today's Date: 08/18/2018 SLP Individual Time: 0601-5615 SLP Individual Time Calculation (min): 40 min  Short Term Goals: Week 1: SLP Short Term Goal 1 (Week 1): Patient will demonstrate efficient mastication of regular textures with complete oral clearance and without overt s/s of aspiration over 2 sessions with Mod I prior to upgrade.  SLP Short Term Goal 2 (Week 1): Patient will improve speech intelligibility to 100% at the sentence level with supervision verbal cues for use of speech intelligibility strategies.  SLP Short Term Goal 3 (Week 1): Patient will demonstrate complex problem solving with functional tasks with Mod I.  SLP Short Term Goal 4 (Week 1): Patient will recall new, daily information with Mod I.  SLP Short Term Goal 5 (Week 1): Patient will self-monitor and correct errors during functional tasks with Mod I.   Skilled Therapeutic Interventions: Skilled treatment session focused on dysphagia and speech goals. SLP facilitated session by providing skilled observation with breakfast meal of Dys. 3 textures with thin liquids. Patient consumed meal without overt s/s of aspiration and utilized liquid washes to assist in clearing oral residue. Patient also utilized liquid washes to clear trials of regular textures. However,  recommend trial tray of regular textures prior to upgrade.  Patient continues to demonstrate a wet vocal quality that he spontaneously clears intermittently and attempts to apply pressure to stoma but continues to require Min verbal cues for placement and amount of pressure in order to maximize overall intelligibility. Patient left upright in bed with alarm on and all needs within reach. Continue with current plan of care.      Pain  No/Denies Pain   Therapy/Group: Individual Therapy  Kalei Meda 08/18/2018, 7:45 AM

## 2018-08-18 NOTE — Progress Notes (Signed)
Patient given prn trazodone 50 mg po at HS per request for sleep-effective. Pt slept well throughout the night. O2 at 1lpm via [place]. No distress noted.

## 2018-08-18 NOTE — Progress Notes (Signed)
[facility] PHYSICAL MEDICINE & REHABILITATION PROGRESS NOTE   Subjective/Complaints: Up in bed. Abs and right knee sore from exercise. Otherwise doing ok  ROS: Patient denies fever, rash, sore throat, blurred vision, nausea, vomiting, diarrhea, cough, shortness of breath or chest pain,  headache, or mood change.      Objective:   [place] Lower Extremity Venous (dvt)  Result Date: 08/17/2018  Lower Venous Study Indications: Edema.  Performing Technologist: Jeb LeveringJill Parker RDMS, RVT  Examination Guidelines: A complete evaluation includes B-mode imaging, spectral Doppler, color Doppler, and power Doppler as needed of all accessible portions of each vessel. Bilateral testing is considered an integral part of a complete examination. Limited examinations for reoccurring indications may be performed as noted.  +---------+---------------+---------+-----------+----------+-------+ RIGHT    CompressibilityPhasicitySpontaneityPropertiesSummary +---------+---------------+---------+-----------+----------+-------+ CFV      Full           Yes      Yes                          +---------+---------------+---------+-----------+----------+-------+ SFJ      Full                                                 +---------+---------------+---------+-----------+----------+-------+ FV Prox  Full                                                 +---------+---------------+---------+-----------+----------+-------+ FV Mid   Full                                                 +---------+---------------+---------+-----------+----------+-------+ FV DistalFull                                                 +---------+---------------+---------+-----------+----------+-------+ PFV      Full                                                 +---------+---------------+---------+-----------+----------+-------+ POP      Full           Yes      Yes                           +---------+---------------+---------+-----------+----------+-------+ PTV      Full                                                 +---------+---------------+---------+-----------+----------+-------+ PERO     Full                                                 +---------+---------------+---------+-----------+----------+-------+   +---------+---------------+---------+-----------+----------+-------+  LEFT     CompressibilityPhasicitySpontaneityPropertiesSummary +---------+---------------+---------+-----------+----------+-------+ CFV      Full           Yes      Yes                          +---------+---------------+---------+-----------+----------+-------+ SFJ      Full                                                 +---------+---------------+---------+-----------+----------+-------+ FV Prox  Full                                                 +---------+---------------+---------+-----------+----------+-------+ FV Mid   Full                                                 +---------+---------------+---------+-----------+----------+-------+ FV DistalFull                                                 +---------+---------------+---------+-----------+----------+-------+ PFV      Full                                                 +---------+---------------+---------+-----------+----------+-------+ POP      Full           Yes      Yes                          +---------+---------------+---------+-----------+----------+-------+ PTV      Full                                                 +---------+---------------+---------+-----------+----------+-------+ PERO     Full                                                 +---------+---------------+---------+-----------+----------+-------+     Summary: Right: There is no evidence of deep vein thrombosis in the lower extremity. No cystic structure found in the popliteal fossa. Left: There is  no evidence of deep vein thrombosis in the lower extremity. No cystic structure found in the popliteal fossa.  *See table(s) above for measurements and observations. Electronically signed by Coral Else MD on 08/17/2018 at 5:06:57 PM.    Final    Recent Labs    08/16/18 0510  WBC 9.9  HGB 10.3*  HCT 34.7*  PLT 341   Recent Labs    08/16/18 0510 08/18/18 0641  NA 142 142  K 3.9 3.9  CL 104 102  CO2 28 28  GLUCOSE 109* 129*  BUN 22* 19  CREATININE 0.74 0.71  CALCIUM 9.1 9.6    Intake/Output Summary (Last 24 hours) at 08/18/2018 0854 Last data filed at 08/18/2018 0800 Gross per 24 hour  Intake 960 ml  Output 450 ml  Net 510 ml     Physical Exam: Vital Signs Blood pressure 98/79, pulse 88, temperature 98.3 F (36.8 C), temperature source Oral, resp. rate 17, height  (1.905 m), weight 86.4 kg, SpO2 94 %.  Constitutional: No distress . Vital signs reviewed. HEENT: EOMI, oral membranes moist Neck: supple, stoma still leaking air, closing though Cardiovascular: RRR without murmur. No JVD    Respiratory: CTA Bilaterally without wheezes or rales. Normal effort    GI: BS +, non-tender, non-distended  Musculoskeletal:  General: No edema.  Neurological: He isalertand oriented to person, place, and time. Speech dysphonic, needs cues to occlude stoma. fair insight and awareness. Normal language, follows all commands. UE 4-5/5 prox to distal except for 3/5 strength in HI right more preserved than left. LE: 3-4/5 prox to distal.  Motor exam stable Skin: Skin iswarmand dry.  Psychiatric: pleasant   Assessment/Plan: 1. Functional deficits secondary to debility after multiple medical which require 3+ hours per day of interdisciplinary therapy in a comprehensive inpatient rehab setting.  Physiatrist is providing close team supervision and 24 hour management of active medical problems listed below.  Physiatrist and rehab team continue to assess barriers to  discharge/monitor patient progress toward functional and medical goals  Care Tool:  Bathing  Bathing activity did not occur: Safety/medical concerns Body parts bathed by patient: Right arm, Right upper leg, Left arm, Left upper leg, Abdomen, Right lower leg, Chest, Front perineal area, Left lower leg, Buttocks, Face         Bathing assist Assist Level: Minimal Assistance - Patient > 75%     Upper Body Dressing/Undressing Upper body dressing   What is the patient wearing?: Pull over shirt    Upper body assist Assist Level: Supervision/Verbal cueing    Lower Body Dressing/Undressing Lower body dressing      What is the patient wearing?: Underwear/pull up, Pants     Lower body assist Assist for lower body dressing: Minimal Assistance - Patient > 75%     Toileting Toileting    Toileting assist Assist for toileting: Minimal Assistance - Patient > 75% Assistive Device Comment: (bed pan)   Transfers Chair/bed transfer  Transfers assist     Chair/bed transfer assist level: Contact Guard/Touching assist     Locomotion Ambulation   Ambulation assist      Assist level: Minimal Assistance - Patient > 75% Assistive device: Walker-rolling Max distance: 40 ft    Walk 10 feet activity   Assist     Assist level: Minimal Assistance - Patient > 75% Assistive device: Walker-rolling   Walk 50 feet activity   Assist    Assist level: Minimal Assistance - Patient > 75% Assistive device: Walker-rolling    Walk 150 feet activity   Assist Walk 150 feet activity did not occur: Safety/medical concerns         Walk 10 feet on uneven surface  activity   Assist Walk 10 feet on uneven surfaces activity did not occur: Safety/medical concerns         Wheelchair     Assist Will patient use wheelchair at discharge?: (TBD) Type of Wheelchair: Manual    Wheelchair assist level: Supervision/Verbal cueing Max  wheelchair distance: 125 ft    Wheelchair  50 feet with 2 turns activity    Assist        Assist Level: Supervision/Verbal cueing   Wheelchair 150 feet activity     Assist Wheelchair 150 feet activity did not occur: Safety/medical concerns        Medical Problem List and Plan: 1.Functional and mobility deficitssecondary to respiratory failure and multiple medical issues --Continue CIR therapies including PT, OT, and SLP  2. Antithrombotics: -DVT/anticoagulation: lovenox 40mg  q12             -dopplers negative 4/15 -antiplatelet therapy: asa 325mg  3. Pain Management: tylenol and hydrocodone 4. Mood:  -antipsychotic agents: n/a             -trazodone 25-50mg  qhs for sleep 5. Neuropsych: This patientiscapable of making decisions on hisown behalf. 6. Skin/Wound Care: local care to trach/PEG sites to promote healing  -pressure to trach stoma             -maximize nutrition 7. Fluids/Electrolytes/Nutrition:              -D3 and thins diet currently  -eating 100%              -I personally reviewed all of the patient's labs today, and lab work is within normal limits.   8. Type 2 DM:  good control at present  -will see if we can wean off lantus, decrease to 5u qhs  -SSI as needed    9. Pulmonary: decannulated, s/p thoracentesis---negative for malignancy             -oxygen therapy via  to keep sats >90%             -pt has not tolerated CPAP---revisit as outpt             -robitussin dm prn for cough 10. Atrial fibrillation: amiodarone for rate controlled 11. Anemia of chronic disease: hgb 10.3    LOS: 3 days A FACE TO FACE EVALUATION WAS PERFORMED  Ranelle Oyster 08/18/2018, 8:54 AM

## 2018-08-18 NOTE — Progress Notes (Signed)
Occupational Therapy Session Note  Patient Details  Name: Luis Choi MRN: [id number] Date of Birth: March 25, 1966  Today's Date: 08/18/2018 OT Individual Time: 1515-1550 OT Individual Time Calculation (min): 35 min    Short Term Goals: Week 1:  OT Short Term Goal 1 (Week 1): Pt will maintain SpO2 > 94% on RA during 1 standing level grooming task OT Short Term Goal 2 (Week 1): Pt will don pants using LRAD with CGA OT Short Term Goal 3 (Week 1): Pt will don B socks with LRAD with CGA OT Short Term Goal 4 (Week 1): Pt will transfer to comfort height toilet with CGA   Skilled Therapeutic Interventions/Progress Updates:    1:1. Pt received in recliner with no report of pain. Pt completes ambulation with RW recliner>w/c>EOB with CGA and VC for scooting to edge prior to sit to stand. Pt completes 4 rounds of standing with MIN-CGA with RW while playing game of giant connect 4. Pt O2 >93% throughout session on RW with VC for holding stoma during deep breathing rest breaks. Exited session with pt seated in bed, exit alarm on and call light in reach  Therapy Documentation Precautions:  Precautions Precautions: Fall Restrictions Weight Bearing Restrictions: No Other Position/Activity Restrictions: PEG tube  Therapy/Group: Individual Therapy  Shon Hale 08/18/2018, 3:54 PM

## 2018-08-18 NOTE — Progress Notes (Signed)
Social Work  Social Work Assessment and Plan  Patient Details  Name: Luis Choi MRN: [id number] Date of Birth: 28-Jun-1965  Today's Date: 08/18/2018  Problem List:  Patient Active Problem List   Diagnosis Date Noted  . Debility 08/15/2018  . Acute on chronic respiratory failure with hypoxia (HCC)   . Chronic atrial fibrillation   . Pneumonia of both lungs due to Pseudomonas species (HCC)   . Pleural effusion, left   . Muscular dystrophy [facility](HCC)    Past Medical History:  Past Medical History:  Diagnosis Date  . Acute on chronic respiratory failure with hypoxia (HCC)   . Chronic atrial fibrillation   . Hepatitis A   . Muscular dystrophy (HCC)   . Pleural effusion, left   . Pneumonia of both lungs due to Pseudomonas species [facility](HCC)    Past Surgical History:  Past Surgical History:  Procedure Laterality Date  . CATARACT EXTRACTION, BILATERAL  2018   with implants  . CHOLECYSTECTOMY  2010   CBD cyst with enlarged duct/cyst  . COLONOSCOPY W/ POLYPECTOMY    . PANCREATIC CYST DRAINAGE  2010   due trauma  . THYROIDECTOMY, PARTIAL Right 12/2009   Social History:  reports that he has never smoked. He has never used smokeless tobacco. No history on file for alcohol and drug.  Family / Support Systems Marital Status: Married Patient Roles: Spouse, Parent Spouse/Significant Other: wife, Luis Choi @ (H) [phone] or (C) [phone] Children: they have 10y.o. twins in the home Anticipated Caregiver: pt's wife Luis StanleyLisa Ability/Limitations of Caregiver: works days Medical laboratory scientific officerCaregiver Availability: Intermittent(but can take time from work with initial transition to home) Family Dynamics: Pt describes wife as very supportive and notes extended family in the area as well.    Social History Preferred language: English Religion: None Cultural Background: NA Read: Yes Write: Yes Employment Status: Unemployed(June 2019) Legal History/Current Legal Issues: None Guardian/Conservator: None -  per MD, pt is capable of making decisions on his own behalf.   Abuse/Neglect Abuse/Neglect Assessment Can Be Completed: Yes Physical Abuse: Denies Verbal Abuse: Denies Sexual Abuse: Denies Exploitation of patient/patient's resources: Denies Self-Neglect: Denies  Emotional Status Pt's affect, behavior and adjustment status: Pt lying in bed and admits much fatigue from therapies.  He is very pleasant and completes assessment interview without much difficulty, however, speech volume low at times.  Pt admits that he is very eager to d/c home and is mentally faigued from lengthy hospitalization.  He denies any significant emotional distress initially and then notes that his mother is currently under Hospice care in a ALF.  Wife feels this is "weighing heavy on him...".  Have referred for neuropsychology consult to provide additional mood support.   Recent Psychosocial Issues: Pt notes that his MD had advanced to the point he could no longer work and had to leave job last summer.   Psychiatric History: None Substance Abuse History: None  Patient / Family Perceptions, Expectations & Goals Pt/Family understanding of illness & functional limitations: Pt and wife with good, general understanding of his lengthy medical course and of current functional limitations/ need for CIR. Premorbid pt/family roles/activities: Pt was independent overall, however, was no longer working. Anticipated changes in roles/activities/participation: Per goals of supervision, wife to provide primary caregiver role/ support. Pt/family expectations/goals: "I just hope I can be home by the end of next week.  Be able to get around some at home."  [employer] Community Agencies: None Premorbid Home Care/DME Agencies: None Transportation available at discharge: yes  Resource referrals recommended: Neuropsychology, Support group (specify)  Discharge Planning Living Arrangements: Spouse/significant other,  Children Support Systems: Spouse/significant other, Children, Other relatives Type of Residence: Private residence Insurance Resources: Media planner (specify)([employer]) Surveyor, quantity Resources: Family Support Financial Screen Referred: No Living Expenses: Mortgage Money Management: Spouse Does the patient have any problems obtaining your medications?: No Home Management: Shared between pt and spouse PTA. Patient/Family Preliminary Plans: Pt to return home with wife and twins.  Wife able to take some time from work and provide assist if needed. Social Work Anticipated Follow Up Needs: HH/OP Expected length of stay: 2 weeks  Clinical Impression Unfortunate gentleman on CIR following very lengthy hospitalization beginning the end of Dec 2019 and now with significant debility.  He is eager to complete program and be home with wife and 36 y.o. twins.  He notes he is mentally fatigued in addition to physical.  He denies any significant emotional distress, however, then notes that his mother is under Hospice care and this is on his mind.  Wife able to take time from work when pt returns home.  Will follow for support and d/c planning needs.  Ryo Klang 08/18/2018, 3:31 PM

## 2018-08-18 NOTE — IPOC Note (Signed)
Overall Plan of Care [facility](IPOC) Patient Details Name: Luis Choi MRN: [id number] DOB: 11/08/1965  Admitting Diagnosis: <principal problem not specified>  Hospital Problems: Active Problems:   Debility     Functional Problem List: Nursing Bowel, Endurance, Medication Management, Safety  PT Balance, Endurance, Motor, Sensory, Nutrition  OT Balance, Sensory, Skin Integrity, Endurance, Motor, Safety  SLP Cognition, Linguistic  TR         Basic ADL's: OT Dressing, Toileting, Bathing     Advanced  ADL's: OT       Transfers: PT Bed Mobility, Bed to Chair, Car, Occupational psychologisturniture  OT Toilet, Research scientist (life sciences)Tub/Shower     Locomotion: PT Ambulation, Psychologist, prison and probation servicesWheelchair Mobility, Stairs     Additional Impairments: OT None  SLP Swallowing, Communication, Social Cognition expression Awareness, Memory  TR      Anticipated Outcomes Item Anticipated Outcome  Self Feeding no goal set  Swallowing  Mod I   Basic self-care  (S)  Toileting  (S)   Bathroom Transfers (S)  Bowel/Bladder  manage bowel with mod I assist  Transfers  supervision for bed<>chair transfers  Locomotion  supervision ambulation with LRAD  Communication  Mod I  Cognition  Mod I   Pain     Safety/Judgment  maintain safety with cues/reminders   Therapy Plan: PT Intensity: Minimum of 1-2 x/day ,45 to 90 minutes PT Frequency: 5 out of 7 days PT Duration Estimated Length of Stay: 2-2.5 weeks OT Intensity: Minimum of 1-2 x/day, 45 to 90 minutes OT Frequency: 5 out of 7 days OT Duration/Estimated Length of Stay: 14-18 days SLP Intensity: Minumum of 1-2 x/day, 30 to 90 minutes SLP Frequency: 3 to 5 out of 7 days SLP Duration/Estimated Length of Stay: 2 weeks    Due to the current state of emergency, patients may not be receiving their 3-hours of Medicare-mandated therapy.   Team Interventions: Nursing Interventions Bowel Management, Patient/Family Education, Disease Management/Prevention, Medication Management, Discharge  Planning  PT interventions Ambulation/gait training, Discharge planning, DME/adaptive equipment instruction, Functional mobility training, Pain management, Psychosocial support, Splinting/orthotics, Therapeutic Activities, UE/LE Strength taining/ROM, Warden/rangerBalance/vestibular training, Community reintegration, Disease management/prevention, Development worker, international aidunctional electrical stimulation, Neuromuscular re-education, Patient/family education, Museum/gallery curatortair training, Therapeutic Exercise, UE/LE Coordination activities, Wheelchair propulsion/positioning  OT Interventions Warden/rangerBalance/vestibular training, Disease mangement/prevention, Self Care/advanced ADL retraining, Therapeutic Exercise, Wheelchair propulsion/positioning, Cognitive remediation/compensation, DME/adaptive equipment instruction, Pain management, UE/LE Strength taining/ROM, FirefighterCommunity reintegration, Equities traderatient/family education, UE/LE Coordination activities, Therapeutic Activities, Psychosocial support, Functional mobility training, Discharge planning  SLP Interventions Cognitive remediation/compensation, Environmental controls, Internal/external aids, Speech/Language facilitation, Therapeutic Activities, Patient/family education, Functional tasks, Dysphagia/aspiration precaution training, Cueing hierarchy  TR Interventions    SW/CM Interventions Discharge Planning, Psychosocial Support, Patient/Family Education   Barriers to Discharge MD  Medical stability  Nursing      PT Inaccessible home environment, Decreased caregiver support, Home environment access/layout, Lack of/limited family support steps to enter house without rails, unsure if pt's wife can provide 24 hr supervision  OT      SLP      SW       Team Discharge Planning: Destination: PT-Home ,OT- Home , SLP-Home Projected Follow-up: PT-Home health PT, 24 hour supervision/assistance, OT-  Home health OT, SLP-None Projected Equipment Needs: PT-To be determined, OT- To be determined, SLP-None recommended by  SLP Equipment Details: PT- , OT-  Patient/family involved in discharge planning: PT- Patient,  OT-Patient, SLP-Patient  MD ELOS: 14-17 days Medical Rehab Prognosis:  Excellent Assessment: The patient has been admitted for CIR therapies with the diagnosis of debility after respiratory  failure. The team will be addressing functional mobility, strength, stamina, balance, safety, adaptive techniques and equipment, self-care, bowel and bladder mgt, patient and caregiver education, wound care, swallowing, communication, cognition, community reentry. Goals have been set at supervision for self-care and mobility and mod I for cognition, communication, swallowing.   Due to the current state of emergency, patients may not be receiving their 3 hours per day of Medicare-mandated therapy.    Ranelle Oyster, MD, FAAPMR      See Team Conference Notes for weekly updates to the plan of care

## 2018-08-19 ENCOUNTER — Inpatient Hospital Stay (HOSPITAL_COMMUNITY): Payer: BLUE CROSS/BLUE SHIELD

## 2018-08-19 ENCOUNTER — Ambulatory Visit (HOSPITAL_COMMUNITY): Payer: BLUE CROSS/BLUE SHIELD | Admitting: Speech Pathology

## 2018-08-19 ENCOUNTER — Inpatient Hospital Stay (HOSPITAL_COMMUNITY): Payer: BLUE CROSS/BLUE SHIELD | Admitting: Physical Therapy

## 2018-08-19 ENCOUNTER — Inpatient Hospital Stay (HOSPITAL_COMMUNITY): Payer: BLUE CROSS/BLUE SHIELD | Admitting: Speech Pathology

## 2018-08-19 LAB — GLUCOSE, CAPILLARY
Glucose-Capillary: 109 mg/dL — ABNORMAL HIGH (ref 70–99)
Glucose-Capillary: 102 mg/dL — ABNORMAL HIGH (ref 70–99)
Glucose-Capillary: 110 mg/dL — ABNORMAL HIGH (ref 70–99)
Glucose-Capillary: 120 mg/dL — ABNORMAL HIGH (ref 70–99)

## 2018-08-19 NOTE — Progress Notes (Signed)
Physical Therapy Session Note  Patient Details  Name: Luis Choi MRN: [id number] Date of Birth: 17-Aug-1965  Today's Date: 08/19/2018 PT Individual Time: [id number] PT Individual Time Calculation (min): 71 min   Short Term Goals: Week 1:  PT Short Term Goal 1 (Week 1): Pt will ambulate ~13500ft using LRAD with no more than CGA PT Short Term Goal 2 (Week 1): Pt will perform sit<>stand using LRAD with no more than CGA PT Short Term Goal 3 (Week 1): Pt will performed bed<>chair transfer using LRAD with no more than CGA PT Short Term Goal 4 (Week 1): Pt will ascend/descend 4 steps using unilateral handrail with no more than min assist  Skilled Therapeutic Interventions/Progress Updates:  Pt received in bed & agreeable to tx. Pt on 1L/min oxygen via nasal cannula at rest & SpO2 = 97%, removed oxygen & SpO2 dropped to 85 % on room air at rest but increased to >90% with instructions and pt performing pursed lip breathing. Pt transferred to sitting EOB with supervision, HOB elevated & bed rails and SpO2 = 96% on room air with pursed lip breathing. Pt donned pants with supervision and min assist for sit>stand 2/2 pt using momentum to assist with transfer and with posterior LOB but ability to correct by pushing back on bed with BLE. After donning pants SpO2 = 91% but increased to 95% on room air, HR = 125 bpm. Pt ambulates to door and back with RW & min assist with cuing for pursed lip breathing throughout & after task SpO2 = 92% increasing to 94% on room air, HR = 127 bpm. Pt ambulates to door & back a 2nd time in same manner as noted above & SpO2 = 95%, HR = 125 bpm. From elevated EOB pt performs 5x sit<>stand with cuing for hand placement on stable surface, focusing on preventing pushing back on bed with BLE and eccentric control with lowering pt able to eventually complete task with close supervision. After task pt reports feeling "winded" but SpO2 = 95% on room air with pt performing pursed lip breathing  throughout. While resting, sitting EOB SpO2 does drop to 89% but returns to 92% with pursed lip breathing. Therapist instructed pt to hold head up as pt frequently sits with neck fully flexed due to weakness in neck extensors.  Pt performs 5x sit<>stand task again from elevated bed height with task focusing on BLE strengthening, balance, safety with sit<>stand; afterwards SpO2 = 94% on room air with pt c/o fatigue. Pt performs standing therapeutic exercises with task focusing on BLE strengthening & exercises consisting of marches & mini squats. After 1 set of exercises pt's SpO2 dropped to 87% but increased to >90% quickly with pursed lip breathing. After standing exercises pt reports 7/10 fatigue & feeling hot; BP = 82/63 mmHg (LUE, sitting), HR = 115 bpm. Pt returned to supine with supervision and BP = 106/75 mmHg (LUE, supine) and pt reports he is cooling off, once in supine. After resting supine a couple minutes BP = 94/78 mmHg (LUE), HR = 93 bpm. Pt placed on 1L/min via nasal cannula & SpO2 = 96%. Pt left in bed with alarm set & needs at hand, RN In room & made aware of pt's vitals during session.  Pt reports inability to perform standing heel raises & sit<>stand without BUE support even before admission 2/2 muscular dystrophy.  Therapy Documentation Precautions:  Precautions Precautions: Fall Restrictions Weight Bearing Restrictions: No Other Position/Activity Restrictions: PEG tube    Pain: Denies  c/o pain.    Therapy/Group: Individual Therapy  Sandi Mariscal 08/19/2018, 12:37 PM

## 2018-08-19 NOTE — Care Management (Signed)
[facility] Individual Statement of Services  Patient Name:  RUNAKO SCHERMAN  Date:  08/19/2018  Welcome to the [facility].  Our goal is to provide you with an individualized program based on your diagnosis and situation, designed to meet your specific needs.  With this comprehensive rehabilitation program, you will be expected to participate in at least 3 hours of rehabilitation therapies Monday-Friday, with modified therapy programming on the weekends.  Your rehabilitation program will include the following services:  Physical Therapy (PT), Occupational Therapy (OT), Speech Therapy (ST), 24 hour per day rehabilitation nursing, Therapeutic Recreaction (TR), Neuropsychology, Case Management (Social Worker), Rehabilitation Medicine, Nutrition Services and Pharmacy Services  Weekly team conferences will be held on Tuesdays to discuss your progress.  Your Social Worker will talk with you frequently to get your input and to update you on team discussions.  Team conferences with you and your family in attendance may also be held.  Expected length of stay: 2 weeks   Overall anticipated outcome: supervision  Depending on your progress and recovery, your program may change. Your Social Worker will coordinate services and will keep you informed of any changes. Your Social Worker's name and contact numbers are listed  below.  The following services may also be recommended but are not provided by the [facility]:   Driving Evaluations  Home Health Rehabiltiation Services  Outpatient Rehabilitation Services  Vocational Rehabilitation   Arrangements will be made to provide these services after discharge if needed.  Arrangements include referral to agencies that provide these services.  Your insurance has been verified to be:  [employer] Your primary doctor is:  Dr. Gwinda Passe [place] Dare)  Pertinent information will be shared with your doctor and  your insurance company.  Social Worker:  [place], [place] [phone] or ([phone]   Information discussed with and copy given to patient by: Amada Jupiter, 08/19/2018, 2:55 PM

## 2018-08-19 NOTE — Progress Notes (Signed)
Social Work Patient ID: Luis Choi, male   DOB: 11-17-1965, 53 y.o.   MRN: [id number]   Have reviewed team conference with pt and wife who are aware and agreeable with ELS of 2 weeks and supervision goals overall.  Affan Callow, LCSW

## 2018-08-19 NOTE — Progress Notes (Signed)
Slept well throughout the night. No prns given. No complaints verbalized. Utilized urinal throughout the night.

## 2018-08-19 NOTE — Progress Notes (Signed)
[facility] PHYSICAL MEDICINE & REHABILITATION PROGRESS NOTE   Subjective/Complaints: No new complaints. Had a good night sleep  ROS: Patient denies fever, rash, sore throat, blurred vision, nausea, vomiting, diarrhea, cough, shortness of breath or chest pain,   headache, or mood change.    Objective:   Vas [place] Lower Extremity Venous (dvt)  Result Date: 08/17/2018  Lower Venous Study Indications: Edema.  Performing Technologist: Jeb Levering RDMS, RVT  Examination Guidelines: A complete evaluation includes B-mode imaging, spectral Doppler, color Doppler, and power Doppler as needed of all accessible portions of each vessel. Bilateral testing is considered an integral part of a complete examination. Limited examinations for reoccurring indications may be performed as noted.  +---------+---------------+---------+-----------+----------+-------+ RIGHT    CompressibilityPhasicitySpontaneityPropertiesSummary +---------+---------------+---------+-----------+----------+-------+ CFV      Full           Yes      Yes                          +---------+---------------+---------+-----------+----------+-------+ SFJ      Full                                                 +---------+---------------+---------+-----------+----------+-------+ FV Prox  Full                                                 +---------+---------------+---------+-----------+----------+-------+ FV Mid   Full                                                 +---------+---------------+---------+-----------+----------+-------+ FV DistalFull                                                 +---------+---------------+---------+-----------+----------+-------+ PFV      Full                                                 +---------+---------------+---------+-----------+----------+-------+ POP      Full           Yes      Yes                           +---------+---------------+---------+-----------+----------+-------+ PTV      Full                                                 +---------+---------------+---------+-----------+----------+-------+ PERO     Full                                                 +---------+---------------+---------+-----------+----------+-------+   +---------+---------------+---------+-----------+----------+-------+  LEFT     CompressibilityPhasicitySpontaneityPropertiesSummary +---------+---------------+---------+-----------+----------+-------+ CFV      Full           Yes      Yes                          +---------+---------------+---------+-----------+----------+-------+ SFJ      Full                                                 +---------+---------------+---------+-----------+----------+-------+ FV Prox  Full                                                 +---------+---------------+---------+-----------+----------+-------+ FV Mid   Full                                                 +---------+---------------+---------+-----------+----------+-------+ FV DistalFull                                                 +---------+---------------+---------+-----------+----------+-------+ PFV      Full                                                 +---------+---------------+---------+-----------+----------+-------+ POP      Full           Yes      Yes                          +---------+---------------+---------+-----------+----------+-------+ PTV      Full                                                 +---------+---------------+---------+-----------+----------+-------+ PERO     Full                                                 +---------+---------------+---------+-----------+----------+-------+     Summary: Right: There is no evidence of deep vein thrombosis in the lower extremity. No cystic structure found in the popliteal fossa. Left: There is  no evidence of deep vein thrombosis in the lower extremity. No cystic structure found in the popliteal fossa.  *See table(s) above for measurements and observations. Electronically signed by Luis Else MD on 08/17/2018 at 5:06:57 PM.    Final    No results for input(s): WBC, HGB, HCT, PLT in the last 72 hours. Recent Labs    08/18/18 0641  NA 142  K 3.9  CL 102  CO2 28  GLUCOSE 129*  BUN 19  CREATININE 0.71  CALCIUM 9.6    Intake/Output Summary (Last 24 hours) at 08/19/2018 0921 Last data filed at 08/19/2018 0801 Gross per 24 hour  Intake 720 ml  Output 300 ml  Net 420 ml     Physical Exam: Vital Signs Blood pressure 100/73, pulse 94, temperature 98.4 F (36.9 C), temperature source Oral, resp. rate 16, height 6\' 3"  (1.905 m), weight 86.4 kg, SpO2 93 %.  Constitutional: No distress . Vital signs reviewed. HEENT: EOMI, oral membranes moist Neck: supple Cardiovascular: RRR without murmur. No JVD    Respiratory: CTA Bilaterally without wheezes or rales. Normal effort    GI: BS +, non-tender, non-distended  Musculoskeletal:  General: No edema.  Neurological: He isalertand oriented to person, place, and time. Speech dysphonic, needs cues to occlude stoma. fair insight and awareness. Normal language, follows all commands. UE 4-5/5 prox to distal except for 3/5 strength in HI right more preserved than left. LE: 3-4/5 .   Skin: Skin iswarmand dry.  Psychiatric: Pleasant and cooperative   Assessment/Plan: 1. Functional deficits secondary to debility after multiple medical which require 3+ hours per day of interdisciplinary therapy in a comprehensive inpatient rehab setting.  Physiatrist is providing close team supervision and 24 hour management of active medical problems listed below.  Physiatrist and rehab team continue to assess barriers to discharge/monitor patient progress toward functional and medical goals  Care Tool:  Bathing  Bathing activity did not  occur: Safety/medical concerns Body parts bathed by patient: Right arm, Right upper leg, Left arm, Left upper leg, Abdomen, Right lower leg, Chest, Front perineal area, Left lower leg, Buttocks, Face         Bathing assist Assist Level: Minimal Assistance - Patient > 75%     Upper Body Dressing/Undressing Upper body dressing   What is the patient wearing?: Pull over shirt    Upper body assist Assist Level: Supervision/Verbal cueing    Lower Body Dressing/Undressing Lower body dressing      What is the patient wearing?: Underwear/pull up, Pants     Lower body assist Assist for lower body dressing: Minimal Assistance - Patient > 75%     Toileting Toileting    Toileting assist Assist for toileting: Minimal Assistance - Patient > 75% Assistive Device Comment: (bed pan)   Transfers Chair/bed transfer  Transfers assist     Chair/bed transfer assist level: Contact Guard/Touching assist     Locomotion Ambulation   Ambulation assist      Assist level: Minimal Assistance - Patient > 75% Assistive device: Walker-rolling Max distance: 40'   Walk 10 feet activity   Assist     Assist level: Minimal Assistance - Patient > 75% Assistive device: Walker-rolling   Walk 50 feet activity   Assist    Assist level: Minimal Assistance - Patient > 75% Assistive device: Walker-rolling    Walk 150 feet activity   Assist Walk 150 feet activity did not occur: Safety/medical concerns         Walk 10 feet on uneven surface  activity   Assist Walk 10 feet on uneven surfaces activity did not occur: Safety/medical concerns         Wheelchair     Assist Will patient use wheelchair at discharge?: (TBD) Type of Wheelchair: Manual    Wheelchair assist level: Supervision/Verbal cueing Max wheelchair distance: 125 ft    Wheelchair 50 feet with 2 turns activity    Assist        Assist Level: Supervision/Verbal  cueing   Wheelchair 150 feet  activity     Assist Wheelchair 150 feet activity did not occur: Safety/medical concerns        Medical Problem List and Plan: 1.Functional and mobility deficitssecondary to respiratory failure and multiple medical issues --Continue CIR therapies including PT, OT, and SLP   2. Antithrombotics: -DVT/anticoagulation: lovenox  q12             -dopplers negative 4/15 -antiplatelet therapy: asa  3. Pain Management: tylenol and hydrocodone 4. Mood:  -antipsychotic agents: n/a             -trazodone 25-50mg  qhs for sleep 5. Neuropsych: This patientiscapable of making decisions on hisown behalf. 6. Skin/Wound Care: local care to trach/PEG sites to promote healing  -pressure to trach stoma             -maximize nutrition 7. Fluids/Electrolytes/Nutrition:              -D3 and thins diet currently  -eating 100%                 8. Type 2 DM:  good control at present  -decreased HS lantus to 5u--may be able to dc all together  -SSI as needed    9. Pulmonary: decannulated, s/p thoracentesis---negative for malignancy             -oxygen therapy via [place] to keep sats >90%             -pt has not tolerated CPAP---revisit as outpt             -robitussin dm prn for cough 10. Atrial fibrillation: amiodarone for rate controlled 4/17 11. Anemia of chronic disease: hgb 10.3    LOS: 4 days A FACE TO FACE EVALUATION WAS PERFORMED  Luis Choi 08/19/2018, 9:21 AM

## 2018-08-19 NOTE — Progress Notes (Signed)
Speech Language Pathology Daily Session Note  Patient Details  Name: Luis Choi MRN: [id number] Date of Birth: [birth year]  Today's Date: 08/19/2018  Session 1: SLP Individual Time: 7858-8502 SLP Individual Time Calculation (min): 40 min   Session 2: SLP Individual Time: 7741-2878 SLP Individual Time Calculation (min): 35 min  Short Term Goals: Week 1: SLP Short Term Goal 1 (Week 1): Patient will demonstrate efficient mastication of regular textures with complete oral clearance and without overt s/s of aspiration over 2 sessions with Mod I prior to upgrade.  SLP Short Term Goal 2 (Week 1): Patient will improve speech intelligibility to 100% at the sentence level with supervision verbal cues for use of speech intelligibility strategies.  SLP Short Term Goal 3 (Week 1): Patient will demonstrate complex problem solving with functional tasks with Mod I.  SLP Short Term Goal 4 (Week 1): Patient will recall new, daily information with Mod I.  SLP Short Term Goal 5 (Week 1): Patient will self-monitor and correct errors during functional tasks with Mod I.   Skilled Therapeutic Interventions:   Session 1: Skilled treatment session focused on dysphagia and speech goals. SLP facilitated session by providing skilled observation with trials of regular textures. Patient demonstrated efficient mastication with complete oral clearance with independent use of liquid washes. Recommend trial tray prior to upgrade. SLP also facilitated session by providing intermittent supervision cues for patient to apply pressure to stoma and to clear his wet vocal quality to achieve 90% intelligibility. Patient left upright in bed with all needs within reach and alarm on. Continue with current plan of care.   Session 2: Skilled treatment session focused on dysphagia goals. SLP facilitated session by providing skilled observation with lunch meal of regular textures and thin liquids. Patient consumed tray without overt s/s  of aspiration and was Mod I for use of swallowing compensatory strategies. Recommend patient upgrade to regular textures. Patient left upright in bed with all needs within reach. Continue with current plan of care.      Pain No/Denies Pain   Therapy/Group: Individual Therapy  [name] 08/19/2018, 1:13 PM

## 2018-08-19 NOTE — Progress Notes (Signed)
Occupational Therapy Session Note  Patient Details  Name: Luis Choi MRN: [id number] Date of Birth: 1966/02/19  Today's Date: 08/19/2018 OT Individual Time: 1530-1557 OT Individual Time Calculation (min): 27 min    Short Term Goals: Week 1:  OT Short Term Goal 1 (Week 1): Pt will maintain SpO2 > 94% on RA during 1 standing level grooming task OT Short Term Goal 2 (Week 1): Pt will don pants using LRAD with CGA OT Short Term Goal 3 (Week 1): Pt will don B socks with LRAD with CGA OT Short Term Goal 4 (Week 1): Pt will transfer to comfort height toilet with CGA   Skilled Therapeutic Interventions/Progress Updates:    1:1. Pt reporting need to toilet. CGA for sit to stand with VC for forward weight shifting with momentum. Pt completes ambulation with RW with CGA and VC for looking forward d/t weak cervical extensors. Pt completes clothing management with CGA in standing and hygiene in sitting. Pt completes oral care and hand hygiene at the sink with set up. Pt completes 2x10 cervical flex/ext and rotation for improved head positioning. Exited session with pt seated in bed, exit alarm on and call light in reach.  Therapy Documentation Precautions:  Precautions Precautions: Fall Restrictions Weight Bearing Restrictions: No Other Position/Activity Restrictions: PEG tube General:    Therapy/Group: Individual Therapy  Shon Hale 08/19/2018, 3:38 PM

## 2018-08-20 ENCOUNTER — Inpatient Hospital Stay (HOSPITAL_COMMUNITY): Payer: BLUE CROSS/BLUE SHIELD

## 2018-08-20 ENCOUNTER — Inpatient Hospital Stay (HOSPITAL_COMMUNITY): Payer: BLUE CROSS/BLUE SHIELD | Admitting: Speech Pathology

## 2018-08-20 ENCOUNTER — Inpatient Hospital Stay (HOSPITAL_COMMUNITY): Payer: BLUE CROSS/BLUE SHIELD | Admitting: Physical Therapy

## 2018-08-20 LAB — GLUCOSE, CAPILLARY
Glucose-Capillary: 111 mg/dL — ABNORMAL HIGH (ref 70–99)
Glucose-Capillary: 126 mg/dL — ABNORMAL HIGH (ref 70–99)
Glucose-Capillary: 108 mg/dL — ABNORMAL HIGH (ref 70–99)
Glucose-Capillary: 102 mg/dL — ABNORMAL HIGH (ref 70–99)

## 2018-08-20 NOTE — Progress Notes (Signed)
[facility] PHYSICAL MEDICINE & REHABILITATION PROGRESS NOTE   Subjective/Complaints: Up in bed. A little sore from therapy. Otherwise doing well.   ROS: Patient denies fever, rash, sore throat, blurred vision, nausea, vomiting, diarrhea, cough, shortness of breath or chest pain, joint or back pain, headache, or mood change.   Objective:   No results found. No results for input(s): WBC, HGB, HCT, PLT in the last 72 hours. Recent Labs    08/18/18 0641  NA 142  K 3.9  CL 102  CO2 28  GLUCOSE 129*  BUN 19  CREATININE 0.71  CALCIUM 9.6    Intake/Output Summary (Last 24 hours) at 08/20/2018 1011 Last data filed at 08/20/2018 2409 Gross per 24 hour  Intake 580 ml  Output 200 ml  Net 380 ml     Physical Exam: Vital Signs Blood pressure 101/76, pulse 87, temperature 97.7 F (36.5 C), temperature source Oral, resp. rate 16, height 6\' 3"  (1.905 m), weight 86.4 kg, SpO2 93 %.  Constitutional: No distress . Vital signs reviewed. HEENT: EOMI, oral membranes moist Neck: supple, air leak, dysphonia Cardiovascular: RRR without murmur. No JVD    Respiratory: CTA Bilaterally without wheezes or rales. Normal effort    GI: BS +, non-tender, non-distended  Musculoskeletal:  General: No edema.  Neurological: He isalertand oriented to person, place, and time.   fair insight and awareness. Normal language, follows all commands. UE 4-5/5 prox to distal except for 3/5 strength in HI right more preserved than left. LE: 3+-4/5 .   Skin: Skin iswarmand dry.  Psychiatric: pleasant   Assessment/Plan: 1. Functional deficits secondary to debility after multiple medical which require 3+ hours per day of interdisciplinary therapy in a comprehensive inpatient rehab setting.  Physiatrist is providing close team supervision and 24 hour management of active medical problems listed below.  Physiatrist and rehab team continue to assess barriers to discharge/monitor patient progress toward  functional and medical goals  Care Tool:  Bathing  Bathing activity did not occur: Safety/medical concerns Body parts bathed by patient: Right arm, Right upper leg, Left arm, Left upper leg, Abdomen, Right lower leg, Chest, Front perineal area, Left lower leg, Buttocks, Face         Bathing assist Assist Level: Minimal Assistance - Patient > 75%     Upper Body Dressing/Undressing Upper body dressing   What is the patient wearing?: Pull over shirt    Upper body assist Assist Level: Supervision/Verbal cueing    Lower Body Dressing/Undressing Lower body dressing      What is the patient wearing?: Underwear/pull up, Pants     Lower body assist Assist for lower body dressing: Minimal Assistance - Patient > 75%     Toileting Toileting    Toileting assist Assist for toileting: Minimal Assistance - Patient > 75% Assistive Device Comment: (bed pan)   Transfers Chair/bed transfer  Transfers assist     Chair/bed transfer assist level: Contact Guard/Touching assist     Locomotion Ambulation   Ambulation assist      Assist level: Minimal Assistance - Patient > 75% Assistive device: Walker-rolling Max distance: 15 ft   Walk 10 feet activity   Assist     Assist level: Minimal Assistance - Patient > 75% Assistive device: Walker-rolling   Walk 50 feet activity   Assist    Assist level: Minimal Assistance - Patient > 75% Assistive device: Walker-rolling    Walk 150 feet activity   Assist Walk 150 feet activity did not occur:  Safety/medical concerns         Walk 10 feet on uneven surface  activity   Assist Walk 10 feet on uneven surfaces activity did not occur: Safety/medical concerns         Wheelchair     Assist Will patient use wheelchair at discharge?: (TBD) Type of Wheelchair: Manual    Wheelchair assist level: Supervision/Verbal cueing Max wheelchair distance: 125 ft    Wheelchair 50 feet with 2 turns  activity    Assist        Assist Level: Supervision/Verbal cueing   Wheelchair 150 feet activity     Assist Wheelchair 150 feet activity did not occur: Safety/medical concerns        Medical Problem List and Plan: 1.Functional and mobility deficitssecondary to respiratory failure and multiple medical issues --Continue CIR therapies including PT, OT, and SLP   2. Antithrombotics: -DVT/anticoagulation: lovenox 40mg  q12             -dopplers negative 4/15 -antiplatelet therapy: asa 325mg  3. Pain Management: tylenol and hydrocodone 4. Mood:  -antipsychotic agents: n/a             -trazodone 25-50mg  qhs for sleep 5. Neuropsych: This patientiscapable of making decisions on hisown behalf. 6. Skin/Wound Care: local care to trach/PEG sites to promote healing  -pressure to trach stoma reviewed, actually is closing             -maximize nutrition 7. Fluids/Electrolytes/Nutrition:              -D3 and thins diet currently  -eating very well                 8. Type 2 DM:  good control at present  -decreased HS lantus to 5u--will dc tonight  -SSI as needed    9. Pulmonary: decannulated, s/p thoracentesis---negative for malignancy             -oxygen therapy via [place] to keep sats >90%             -pt has not tolerated CPAP---revisit as outpt             -robitussin dm prn for cough 10. Atrial fibrillation: amiodarone   - rate controlled 4/18 11. Anemia of chronic disease: hgb 10.3    LOS: 5 days A FACE TO FACE EVALUATION WAS PERFORMED  Ranelle OysterZachary T Tarig Zimmers 08/20/2018, 10:11 AM

## 2018-08-20 NOTE — Progress Notes (Signed)
Speech Language Pathology Daily Session Note  Patient Details  Name: Luis Choi MRN: [id number] Date of Birth: May 25, 1965  Today's Date: 08/20/2018 SLP Individual Time: 1055-1150 SLP Individual Time Calculation (min): 55 min  Short Term Goals: Week 1: SLP Short Term Goal 1 (Week 1): Patient will demonstrate efficient mastication of regular textures with complete oral clearance and without overt s/s of aspiration over 2 sessions with Mod I prior to upgrade.  SLP Short Term Goal 2 (Week 1): Patient will improve speech intelligibility to 100% at the sentence level with supervision verbal cues for use of speech intelligibility strategies.  SLP Short Term Goal 3 (Week 1): Patient will demonstrate complex problem solving with functional tasks with Mod I.  SLP Short Term Goal 4 (Week 1): Patient will recall new, daily information with Mod I.  SLP Short Term Goal 5 (Week 1): Patient will self-monitor and correct errors during functional tasks with Mod I.   Skilled Therapeutic Interventions: Patient received skilled SLP services targeting cognitive skills. Patient responded to complex problem solving scenarios for math calculations with 100% accuracy independently. Patient completed immediate recall task for information from newspaper articles with 100% accuracy independently. Patient participated in word list retention task: recall by attribute with 100% accuracy. Patient independently identified utilizing his phone as a external compensatory memory strategy. Patient reports his meal trays have been regular textures since last evening. Patient reports no difficulty with regular textures stating they are not much different from the soft diet he was on.   Pain Pain Assessment Pain Scale: 0-10 Pain Score: 0-No pain  Therapy/Group: Individual Therapy  Arnette Schaumann, MS, CCC-SLP 08/20/2018, 12:01 PM

## 2018-08-20 NOTE — Plan of Care (Signed)
  Problem: Consults Goal: RH GENERAL PATIENT EDUCATION Description See Patient Education module for education specifics. Outcome: Progressing   Problem: RH SAFETY Goal: RH STG ADHERE TO SAFETY PRECAUTIONS W/ASSISTANCE/DEVICE Description STG Adhere to Safety Precautions With cues/reminders Assistance/Device.  Outcome: Progressing   Problem: RH KNOWLEDGE DEFICIT GENERAL Goal: RH STG INCREASE KNOWLEDGE OF SELF CARE AFTER HOSPITALIZATION Description Pt will be able to direct care at discharge independently using handouts/educational resources  Outcome: Progressing

## 2018-08-20 NOTE — Progress Notes (Signed)
Physical Therapy Session Note  Patient Details  Name: Luis Choi MRN: [id number] Date of Birth: 01-15-1966  Today's Date: 08/20/2018 PT Individual Time: 5997-7414 PT Individual Time Calculation (min): 53 min   Short Term Goals: Week 1:  PT Short Term Goal 1 (Week 1): Pt will ambulate ~136ft using LRAD with no more than CGA PT Short Term Goal 2 (Week 1): Pt will perform sit<>stand using LRAD with no more than CGA PT Short Term Goal 3 (Week 1): Pt will performed bed<>chair transfer using LRAD with no more than CGA PT Short Term Goal 4 (Week 1): Pt will ascend/descend 4 steps using unilateral handrail with no more than min assist  Skilled Therapeutic Interventions/Progress Updates:    BP supine: 107/70, BP sitting 117/77.  Pt agreeable to therapy, states fatigue but willing to participate with frequent rests.  Pt performs sit <> stand with min A from elevated surface, mod A to stand with RW from toilet.  Gait 15' x 2 with min A.  Standing balance to wash hands with initial min A, mod A required due to Lt knee buckling.  Pt then requests seated therex.  Pt performs 2 x 10 marching, LAQ, hip abd/add, ankle pumps.  Pt given HEP of supine therex to perform in time off from therapy to keep strength up. Pt verbalizes understanding.  spO2 96% on room air after gait. Pt left in bed with nursing present for dressing change.  Therapy Documentation Precautions:  Precautions Precautions: Fall Restrictions Weight Bearing Restrictions: No Other Position/Activity Restrictions: PEG tube Pain: Pain Assessment Pain Scale: 0-10 Pain Score: 0-No pain   Therapy/Group: Individual Therapy  Florina Glas 08/20/2018, 1:51 PM

## 2018-08-20 NOTE — Progress Notes (Signed)
Slept well throughout the night. No complaints noted.  

## 2018-08-20 NOTE — Progress Notes (Signed)
Occupational Therapy Session Note  Patient Details  Name: Luis Choi MRN: [id number] Date of Birth: 09-16-1965  Today's Date: 08/20/2018 OT Individual Time: 5885-0277 OT Individual Time Calculation (min): 73 min    Short Term Goals: Week 1:  OT Short Term Goal 1 (Week 1): Pt will maintain SpO2 > 94% on RA during 1 standing level grooming task OT Short Term Goal 2 (Week 1): Pt will don pants using LRAD with CGA OT Short Term Goal 3 (Week 1): Pt will don B socks with LRAD with CGA OT Short Term Goal 4 (Week 1): Pt will transfer to comfort height toilet with CGA   Skilled Therapeutic Interventions/Progress Updates:    1;1. Pt reiceved in bed reporting need to have BM. Pt completes ambulation with RW with MIN A for sit to stand and CGA for ambulation to toilet pt completes clothing management with CGA in standing and hygiene with supervision. Pt transfers into showers after occlusives applied to stoma/PEG. Pt bath3es sit to stand with CGA for standing balnce during peri washing (grab bar use for steadying). Pt completes oral care at sink seated in w/c. Pt completes UB dressing with set up and LB dressing with CGA sit to stand at sink. Pt completes wii bowling game standing 2 frames for standing/activity tolerance with CGA and sitting 1 frame for active rest breaks. Exited session with pt seated in recliner, call light in reach and belt alamr on  Therapy Documentation Precautions:  Precautions Precautions: Fall Restrictions Weight Bearing Restrictions: No Other Position/Activity Restrictions: PEG tube General:   Vital Signs: Therapy Vitals Temp: 97.7 F (36.5 C) Temp Source: Oral Pulse Rate: 87 Resp: 16 BP: 101/76 Patient Position (if appropriate): Lying Oxygen Therapy SpO2: 93 % O2 Device: Nasal Cannula O2 Flow Rate (L/min): 1 L/min Pain:   ADL:   Vision   Perception    Praxis   Exercises:   Other Treatments:     Therapy/Group: Individual Therapy  Shon Hale 08/20/2018, 8:49 AM

## 2018-08-21 ENCOUNTER — Inpatient Hospital Stay (HOSPITAL_COMMUNITY): Payer: BLUE CROSS/BLUE SHIELD

## 2018-08-21 LAB — GLUCOSE, CAPILLARY
Glucose-Capillary: 92 mg/dL (ref 70–99)
Glucose-Capillary: 94 mg/dL (ref 70–99)
Glucose-Capillary: 95 mg/dL (ref 70–99)
Glucose-Capillary: 87 mg/dL (ref 70–99)

## 2018-08-21 NOTE — Progress Notes (Signed)
+/-   sleep. Using O2 at 1L/M at HS. Peg tube in place, redness noted at sight. Patient refused peg to be flushed or dressing to be changed. air leaking from trach stoma, dressing CD&I. Alfredo Martinez A

## 2018-08-21 NOTE — Progress Notes (Signed)
[facility] [place] PHYSICAL MEDICINE & REHABILITATION PROGRESS NOTE   Subjective/Complaints: No problems overnight. Rested somewhat comfortably  ROS: Patient denies fever, rash, sore throat, blurred vision, nausea, vomiting, diarrhea, cough, shortness of breath or chest pain, joint or back pain, headache, or mood change.   Objective:   No results found. No results for input(s): WBC, HGB, HCT, PLT in the last 72 hours. No results for input(s): NA, K, CL, CO2, GLUCOSE, BUN, CREATININE, CALCIUM in the last 72 hours.  Intake/Output Summary (Last 24 hours) at 08/21/2018 1016 Last data filed at 08/21/2018 0835 Gross per 24 hour  Intake 700 ml  Output 425 ml  Net 275 ml     Physical Exam: Vital Signs Blood pressure 105/70, pulse 90, temperature 98.2 F (36.8 C), temperature source Oral, resp. rate 16, height 6\' 3"  (1.905 m), weight 86.4 kg, SpO2 96 %.  Constitutional: No distress . Vital signs reviewed. HEENT: EOMI, oral membranes moist Neck: supple, trach stoma dressed, leaking air Cardiovascular: RRR without murmur. No JVD    Respiratory: CTA Bilaterally without wheezes or rales. Normal effort    GI: BS +, non-tender, non-distended  Musculoskeletal:  General: No edema.  Neurological: He isalertand oriented to person, place, and time.   fair insight and awareness. Normal language, follows all commands. UE 4-5/5 prox to distal except for 3/5 strength in HI right more preserved than left. LE: 3+-4/5 .   Skin: Skin iswarmand dry.  Psychiatric: flat  Assessment/Plan: 1. Functional deficits secondary to debility after multiple medical which require 3+ hours per day of interdisciplinary therapy in a comprehensive inpatient rehab setting.  Physiatrist is providing close team supervision and 24 hour management of active medical problems listed below.  Physiatrist and rehab team continue to assess barriers to discharge/monitor patient progress toward functional and medical  goals  Care Tool:  Bathing  Bathing activity did not occur: Safety/medical concerns Body parts bathed by patient: Right arm, Right upper leg, Left arm, Left upper leg, Abdomen, Right lower leg, Chest, Front perineal area, Left lower leg, Buttocks, Face         Bathing assist Assist Level: Contact Guard/Touching assist     Upper Body Dressing/Undressing Upper body dressing   What is the patient wearing?: Pull over shirt    Upper body assist Assist Level: Supervision/Verbal cueing    Lower Body Dressing/Undressing Lower body dressing      What is the patient wearing?: Underwear/pull up, Pants     Lower body assist Assist for lower body dressing: Contact Guard/Touching assist     Toileting Toileting    Toileting assist Assist for toileting: Contact Guard/Touching assist Assistive Device Comment: (bed pan)   Transfers Chair/bed transfer  Transfers assist     Chair/bed transfer assist level: Contact Guard/Touching assist     Locomotion Ambulation   Ambulation assist      Assist level: Minimal Assistance - Patient > 75% Assistive device: Walker-rolling Max distance: 15 ft   Walk 10 feet activity   Assist     Assist level: Minimal Assistance - Patient > 75% Assistive device: Walker-rolling   Walk 50 feet activity   Assist    Assist level: Minimal Assistance - Patient > 75% Assistive device: Walker-rolling    Walk 150 feet activity   Assist Walk 150 feet activity did not occur: Safety/medical concerns         Walk 10 feet on uneven surface  activity   Assist Walk 10 feet on uneven surfaces activity did not  occur: Safety/medical concerns         Wheelchair     Assist Will patient use wheelchair at discharge?: (TBD) Type of Wheelchair: Manual    Wheelchair assist level: Supervision/Verbal cueing Max wheelchair distance: 125 ft    Wheelchair 50 feet with 2 turns activity    Assist        Assist Level:  Supervision/Verbal cueing   Wheelchair 150 feet activity     Assist Wheelchair 150 feet activity did not occur: Safety/medical concerns        Medical Problem List and Plan: 1.Functional and mobility deficitssecondary to respiratory failure and multiple medical issues --Continue CIR therapies including PT, OT, and SLP   2. Antithrombotics: -DVT/anticoagulation: lovenox 40mg  q12             -dopplers negative 4/15 -antiplatelet therapy: asa 325mg  3. Pain Management: tylenol and hydrocodone 4. Mood:  -antipsychotic agents: n/a             -trazodone 25-50mg  qhs for sleep 5. Neuropsych: This patientiscapable of making decisions on hisown behalf. 6. Skin/Wound Care: local care to trach/PEG sites to promote healing  -numerous staff has reiterated need to keep pressure on trach stoma             -maximize nutrition 7. Fluids/Electrolytes/Nutrition:              -D3 and thins diet currently  -eating very well                 8. Type 2 DM:  good control at present  -decreased HS lantus to 5u--will dc tonight  -SSI as needed    9. Pulmonary: decannulated, s/p thoracentesis---negative for malignancy             -oxygen therapy via [place] to keep sats >90%             -pt has not tolerated CPAP---revisit as outpt             -robitussin dm prn for cough  -wean oxygen to off as able 10. Atrial fibrillation: amiodarone   - rate controlled 4/18 11. Anemia of chronic disease: hgb 10.3    LOS: 6 days A FACE TO FACE EVALUATION WAS PERFORMED  Luis Choi 08/21/2018, 10:16 AM

## 2018-08-21 NOTE — Progress Notes (Signed)
Occupational Therapy Session Note  Patient Details  Name: Luis Choi MRN: [id number] Date of Birth: Sep 22, 1965  Today's Date: 08/21/2018 OT Individual Time: 1400-1530 OT Individual Time Calculation (min): 90 min    Short Term Goals: Week 1:  OT Short Term Goal 1 (Week 1): Pt will maintain SpO2 > 94% on RA during 1 standing level grooming task OT Short Term Goal 2 (Week 1): Pt will don pants using LRAD with CGA OT Short Term Goal 3 (Week 1): Pt will don B socks with LRAD with CGA OT Short Term Goal 4 (Week 1): Pt will transfer to comfort height toilet with CGA   Skilled Therapeutic Interventions/Progress Updates:    Session focused on ADLs and sit <> stand transfers. Pt was received supine in bed with no c/o pain. Pt completed bed mobility to EOB with (S). Min A to transfer sit > stand using RW and bed rail. Pt completed functional mobility into bathroom with CGA. Pt able to manipulate clothing in standing, void urine, and complete peri hygiene. Edu/cueing provided for pt to doff LB clothing seated instead of standing. Pt completed transfer onto [facility] in walk in shower with CGA. Pt's stoma and peg tube were occluded for shower and monitored throughout to ensure they remained dry. Pt able to complete all bathing with (S) from seated level. Pt dryed off and returned to sitting EOB to dress. Pt threaded BLE through underwear and shorts with good carryover of previously discussed energy conservation techniques, using figure 4 method and donning both at one time. Pt completed sit > stand with heavy use of momentum and sat in front of sink in w/c to complete shaving. Following discussion with RN, the dressing on pt's stoma was changed d/t being heavily saturated with secretions. Pt donned shirt (S) and then propelled w/c down to therapy gym, 150 ft, with increased time and several rest breaks.  In therapy gym, pt sat EOM and completed several sit <> stand transfers, graded by changing mat height and UE  assist. Focus placed on glute and quad activation and reduction of momentum used to initiate transfers. Pt returned to room and was left supine in bed with all needs met.   Therapy Documentation Precautions:  Precautions Precautions: Fall Restrictions Weight Bearing Restrictions: No Other Position/Activity Restrictions: PEG tube Pain:   No c/o pain throughout session.    Therapy/Group: Individual Therapy  Curtis Sites 08/21/2018, 7:09 AM

## 2018-08-22 ENCOUNTER — Inpatient Hospital Stay (HOSPITAL_COMMUNITY): Payer: BLUE CROSS/BLUE SHIELD

## 2018-08-22 ENCOUNTER — Encounter (HOSPITAL_COMMUNITY): Payer: BLUE CROSS/BLUE SHIELD | Admitting: Psychology

## 2018-08-22 ENCOUNTER — Inpatient Hospital Stay (HOSPITAL_COMMUNITY): Payer: BLUE CROSS/BLUE SHIELD | Admitting: Physical Therapy

## 2018-08-22 ENCOUNTER — Inpatient Hospital Stay (HOSPITAL_COMMUNITY): Payer: BLUE CROSS/BLUE SHIELD | Admitting: Speech Pathology

## 2018-08-22 LAB — BASIC METABOLIC PANEL
Anion gap: 11 (ref 5–15)
BUN: 14 mg/dL (ref 6–20)
CO2: 27 mmol/L (ref 22–32)
Calcium: 9 mg/dL (ref 8.9–10.3)
Chloride: 104 mmol/L (ref 98–111)
Creatinine, Ser: 0.83 mg/dL (ref 0.61–1.24)
GFR calc Af Amer: >60 mL/min (ref >60)
GFR calc non Af Amer: >60 mL/min (ref >60)
Glucose, Bld: 138 mg/dL — ABNORMAL HIGH (ref 70–99)
Potassium: 4.1 mmol/L (ref 3.5–5.1)
Sodium: 142 mmol/L (ref 135–145)

## 2018-08-22 LAB — GLUCOSE, CAPILLARY
Glucose-Capillary: 91 mg/dL (ref 70–99)
Glucose-Capillary: 110 mg/dL — ABNORMAL HIGH (ref 70–99)
Glucose-Capillary: 104 mg/dL — ABNORMAL HIGH (ref 70–99)
Glucose-Capillary: 129 mg/dL — ABNORMAL HIGH (ref 70–99)

## 2018-08-22 LAB — CBC
HCT: 34.8 % — ABNORMAL LOW (ref 39.0–52.0)
Hemoglobin: 10.6 g/dL — ABNORMAL LOW (ref 13.0–17.0)
MCH: 29.6 pg (ref 26.0–34.0)
MCHC: 30.5 g/dL (ref 30.0–36.0)
MCV: 97.2 fL (ref 80.0–100.0)
Platelets: 394 10*3/uL (ref 150–400)
RBC: 3.58 MIL/uL — ABNORMAL LOW (ref 4.22–5.81)
RDW: 15.1 % (ref 11.5–15.5)
WBC: 9.6 10*3/uL (ref 4.0–10.5)
nRBC: 0 % (ref 0.0–0.2)

## 2018-08-22 NOTE — Progress Notes (Signed)
Speech Language Pathology Discharge Summary  Patient Details  Name: Luis Choi MRN: [id number] Date of Birth: 06/18/1965  Today's Date: 08/22/2018 SLP Individual Time: 1300-1330 SLP Individual Time Calculation (min): 30 min   Skilled Therapeutic Interventions: Skilled treatment session focused on dysphagia goals and completion of patient education. Patient consumed regular textures and thin liquids without overt s/s of aspiration with overall Mod I. Patient was ~90% intelligible at the sentence level and required intermittent supervision verbal cues to apply appropriate amount of pressure to his stoma to maximize intelligibility. Completed education with the patient in regards to strategies to utilize to maximize intelligibility and overall swallowing safety at home. Patient verbalized understanding of all information. Patient left upright in bed with alarm on and all needs within reach.   Patient has met 7 of 7 long term goals.  Patient to discharge at overall Modified Independent level.   Reasons goals not met: N/A   Clinical Impression/Discharge Summary: Patient has made excellent gains and has met 7 of 7 LTGs this admission. Currently, patient is consuming regular textures with thin liquids without overt s/s of aspiration and overall mod I for use of swallowing compensatory strategies. Patient is also overall Mod I to complete functional and familiar tasks safely in regards to recall and problem solving. Patient continues to demonstrate an increased speech rate which patient reports is baseline (wife confirms via telephone) which can intermittently impact his speech intelligibility. However, overall, patient is 90-100% intelligible at the sentence level with Mod I -intermittent supervision verbal cues. Patient and family education complete (via telephone).  Patient has met all goals at this time and will be discharged from skilled SLP intervention with f/u not warranted at this time.    Recommendation:  None      Equipment: N/A   Reasons for discharge: Treatment goals met   Patient/Family Agrees with Progress Made and Goals Achieved: Yes    Candance Bohlman, [place] 08/22/2018, 1:33 PM

## 2018-08-22 NOTE — Progress Notes (Signed)
Occupational Therapy Session Note  Patient Details  Name: Luis Choi MRN: [id number] Date of Birth: 1966/04/26  Today's Date: 08/22/2018 OT Individual Time: 1415-1530 OT Individual Time Calculation (min): 75 min    Short Term Goals: Week 1:  OT Short Term Goal 1 (Week 1): Pt will maintain SpO2 > 94% on RA during 1 standing level grooming task OT Short Term Goal 2 (Week 1): Pt will don pants using LRAD with CGA OT Short Term Goal 3 (Week 1): Pt will don B socks with LRAD with CGA OT Short Term Goal 4 (Week 1): Pt will transfer to comfort height toilet with CGA   Skilled Therapeutic Interventions/Progress Updates:    Pt received supine in bed with no c/o pain but initially declining therapy. OT sat down to talk with pt and he disclosed feeling sad re his mother being in hospice care and in not yet having a d/c date from [place]. Emotional support was provided and discussion re his POC, including a prospective d/c date. Pt agreeable to activity following discussion. Pt used RW to complete functional mobility into bathroom with CGA. Pt completed all steps of toileting with close (S) to CGA. Pt then propelled w/c 150 ft with several rest breaks to therapy gym. He sat EOM and completed sit <> stands with CGA, and then completing functional stepping activity once in standing with unilateral support on RW. Pt ended session with BUE strengthening circuit using 2lb weighted ball. Pt returned to room and left supine with all needs met.   Throughout session pt requiring FREQUENT cueing to occlude stoma while talking and coughing/  Therapy Documentation Precautions:  Precautions Precautions: Fall Restrictions Weight Bearing Restrictions: No Other Position/Activity Restrictions: PEG tube Pain: Pain Assessment Pain Scale: 0-10 Pain Score: 0-No pain   Therapy/Group: Individual Therapy  Curtis Sites 08/22/2018, 3:59 PM

## 2018-08-22 NOTE — Consult Note (Signed)
Neuropsychological Consultation   Patient:   Luis Choi   DOB:   1966/02/21  MR Number:  [id number]  Location:  [facility] [address] CENTER A [address] [id number] [place] [address] Dept: [phone] Loc: [phone]           Date of Service:   08/22/2018  Start Time:   8 AM End Time:   9 AM  Provider/Observer:  Luis Phenix, Psy.D.       Clinical Neuropsychologist       Billing Code/Service: [phone]  Chief Complaint:    Luis Choi is a 53 year old male with history of myotonic muscular dystrophy, PAF, hypothyroidism who was admitted with A.fib with RVR, 2 to 3 months of DOE/orthopnea and required intubation.  He failed extubation  And required tracheostomy on 05/29/2018.  Dx with multidrug-resistant Pseudomonas and other lung issues.  Long hospital course with eventual being weaned off vent.  Respiratory status is improving and improvement with activity tolerance with CIR for functional decline  Reason for Service:  Patient with very long hospital stay.  Referred for neuropsychological consultation due to coping and adjustment issues with current stay and significant future medical needs due to myotonic muscular dystrophy.  Below is the HPI for the current HPI.    Luis Choi is a57 year old male with history of myotonic muscular dystrophy, PAF, hypothyroidism who was admitted to OSH with A. fib with RVR, 2 to 3 months of DOE/orthopnea and required intubation. He was cardioverted and started onamiodarone for heart rate controland treated with sotalol for episode ofSVT. He failed attempts at extubation therefore required tracheostomy on 05/29/18. He was found to have multidrug-resistant Pseudomonas as well as suspicious mass in the chest treated with Zerbaxa.Hospital course significant for eosinophilia as possible side effect of Zyprexa, left chest hemothorax due to ASA ,anemia requiring  transfusion, dysphagia --PEG tube placed on 06/03/18 andmild addisonian crisis requiring stress dose steroids. He was transferred to [facility] on 06/24/2018 for vent weaning. Repeat sputum cultures showed ESBL pseudomonas and Klebsiella and antibiotics changed to Gentamicin/cipro. CT chest done revealing moderate to large bilateral pleural effusions with subtotal collapse of lower lobes and 3 cm X 1.3 cm X 1.5 cm mass in RML worrisome for lung CA and 1.2 cm mass in RUL- inflammatory v/s neoplastic. He underwent thoracocentesis of 1 L amber fluid on 02/25 and pathology negative for malignancy. He has been weaned off vent-->BIPAP and decannulated by 04/10. Has been tolerating dysphagia 3, thins without difficulty. Respiratory status has improved and he is showing improvement in activity tolerance but noted to be deconditioned. CIR recommended due to functional decline.   Current Status:  Patient reports that he is doing better and now that they are working on going home after extensive hospital stay.  Patient reports that he has been stressed further due to mother that is in hospice and he has not been able to see her and now she is not able to communicate.    Behavioral Observation: Luis Choi  presents as a 53 y.o.-year-old Right Caucasian Male who appeared his stated age. his dress was Appropriate and he was Well Groomed and his manners were Appropriate to the situation.  his participation was indicative of Appropriate and Redirectable behaviors.  There were any physical disabilities noted.  he displayed an appropriate level of cooperation and motivation.     Interactions:    Active Appropriate and Redirectable  Attention:   abnormal  and attention span appeared shorter than expected for age  Memory:   within normal limits; recent and remote memory intact  Visuo-spatial:  not examined  Speech (Volume):  low  Speech:   normal; slowed response pattern  Thought Process:  Coherent and  Relevant  Though Content:  WNL; not suicidal and not homicidal  Orientation:   person, place, time/date and situation  Judgment:   Fair  Planning:   Fair  Affect:    Blunted  Mood:    Dysphoric  Insight:   Fair  Intelligence:   normal  Medical History:   Past Medical History:  Diagnosis Date  . Acute on chronic respiratory failure with hypoxia (HCC)   . Chronic atrial fibrillation   . Hepatitis A   . Muscular dystrophy (HCC)   . Pleural effusion, left   . Pneumonia of both lungs due to Pseudomonas species [facility](HCC)    Psychiatric History:  Patient denies psychic history  Family Med/Psych History:  Family History  Problem Relation Age of Onset  . Bladder Cancer Father   . Heart failure Father   . Muscular dystrophy Sister   . Muscular dystrophy Brother   . Dementia Mother     Risk of Suicide/Violence: low Patient denies SI or HI.  Impression/DX:  Luis Choi is a 53 year old male with history of myotonic muscular dystrophy, PAF, hypothyroidism who was admitted with A.fib with RVR, 2 to 3 months of DOE/orthopnea and required intubation.  He failed extubation  And required tracheostomy on 05/29/2018.  Dx with multidrug-resistant Pseudomonas and other lung issues.  Long hospital course with eventual being weaned off vent.  Respiratory status is improving and improvement with activity tolerance with CIR for functional decline.     Disposition/Plan:  Worked on coping and adjustment issues.  Diagnosis:    Debility        Electronically Signed   _______________________ Luis Choi, Psy.D.

## 2018-08-22 NOTE — Progress Notes (Addendum)
Physical Therapy Session Note  Patient Details  Name: Luis Choi MRN: [id number] Date of Birth: 1965/06/10  Today's Date: 08/22/2018 PT Individual Time: 5038-8828 PT Individual Time Calculation (min): 72 min   Short Term Goals: Week 1:  PT Short Term Goal 1 (Week 1): Pt will ambulate ~184ft using LRAD with no more than CGA PT Short Term Goal 2 (Week 1): Pt will perform sit<>stand using LRAD with no more than CGA PT Short Term Goal 3 (Week 1): Pt will performed bed<>chair transfer using LRAD with no more than CGA PT Short Term Goal 4 (Week 1): Pt will ascend/descend 4 steps using unilateral handrail with no more than min assist  Skilled Therapeutic Interventions/Progress Updates:  Pt received in bed & agreeable to tx. Pt on room air upon PT arrival & SpO2 = 90% increasing to 93% with cuing for pursed lip breathing. Pt donned shorts from bed level with set up assist and extra time, bridging to pull pants over hips. Pt transfers to EOB with supervision and hospital bed features, reporting need to use restroom and completes sit>stand with supervision after 2 attempts and cuing for controlled movements. Pt ambulates into bathroom with RW & min assist, managing clothing without assistance and completing toilet transfer with grab bar & min assist. Pt with continent void on toilet, performing peri hygiene without assistance & hand hygiene standing at sink with min assist for balance. In gym, discussed home entry where pt states he has 2 steps of 3" height without rails to enter house with narrow space (3-4 ft) once inside between wall & washing machine - asked pt to have family take pictures of entrance and send them to him. Provided demonstration and educated pt on negotiating stairs backwards with RW then turning once inside, as well as cuing for compensatory pattern with pt return demonstrating 2 steps with RW & min assist x 2 trials. Pt reports he feels he could hold on to door frame instead of going  up backwards with RW, will discuss with pt further once he has pictures of home entrance. Pt then negotiates 8 steps (3") with B rails and min assist with pt unable to fully extend B knees in standing during stairs or gait.  In dayroom pt ambulates 60 ft + 70 ft with RW & min assist with improving ability to hold head upright but continued B knee flexion & dependence on BUE on RW. Pt utilized dynavision while standing with RW and close supervision for balance with alternating 1 UE support with task focusing on BLE strengthening during weight bearing/standing, standing tolerance, balance, and managing pursed lip breathing during task. Pt was able to standing for 1 minute 50 seconds + 1 minute 20 seconds with seated rest break in between 2/2 fatigue. Pt ambulates 15 ft w/c>recliner with min assist & RW. Pt left sitting in recliner with BLE elevated, chair alarm donned & all needs at hand.   Pt requires frequent cuing for safety as pt was more impulsive on this date.   SpO2 remained >90% throughout session on room air after all functional mobility tasks.  Therapy Documentation Precautions:  Precautions Precautions: Fall Restrictions Weight Bearing Restrictions: No Other Position/Activity Restrictions: PEG tube  Pain: Denies c/o pain.    Therapy/Group: Individual Therapy  Sandi Mariscal 08/22/2018, 10:51 AM

## 2018-08-22 NOTE — Progress Notes (Signed)
[place] PHYSICAL MEDICINE & REHABILITATION PROGRESS NOTE   Subjective/Complaints: No new complaints. Asked if we could put glue on his trach stoma to help it close  ROS: Patient denies fever, rash, sore throat, blurred vision, nausea, vomiting, diarrhea, cough, shortness of breath or chest pain, joint or back pain, headache, or mood change. .   Objective:   No results found. Recent Labs    08/22/18 0809  WBC 9.6  HGB 10.6*  HCT 34.8*  PLT 394   Recent Labs    08/22/18 0809  NA 142  K 4.1  CL 104  CO2 27  GLUCOSE 138*  BUN 14  CREATININE 0.83  CALCIUM 9.0    Intake/Output Summary (Last 24 hours) at 08/22/2018 0932 Last data filed at 08/22/2018 0732 Gross per 24 hour  Intake 798 ml  Output 500 ml  Net 298 ml     Physical Exam: Vital Signs Blood pressure 104/71, pulse 87, temperature 98.5 F (36.9 C), temperature source Oral, resp. rate 16, height 6\' 3"  (1.905 m), weight 86.4 kg, SpO2 95 %.  Constitutional: No distress . Vital signs reviewed. HEENT: EOMI, oral membranes moist Neck: supple. Trach stoma still with leak, closing Cardiovascular: RRR without murmur. No JVD    Respiratory: CTA Bilaterally without wheezes or rales. Normal effort    GI: BS +, non-tender, non-distended  Musculoskeletal:  General: No edema.  Neurological: He isalertand oriented to person, place, and time. Improving insight.  Normal language, follows all commands. UE 4-5/5 prox to distal except for 3/5 strength in HI right more preserved than left. LE: 3+-4/5 .   Skin: Skin iswarmand dry.  Psychiatric: flat  Assessment/Plan: 1. Functional deficits secondary to debility after multiple medical which require 3+ hours per day of interdisciplinary therapy in a comprehensive inpatient rehab setting.  Physiatrist is providing close team supervision and 24 hour management of active medical problems listed below.  Physiatrist and rehab team continue to assess barriers to  discharge/monitor patient progress toward functional and medical goals  Care Tool:  Bathing  Bathing activity did not occur: Safety/medical concerns Body parts bathed by patient: Right arm, Right upper leg, Left arm, Left upper leg, Abdomen, Right lower leg, Chest, Front perineal area, Left lower leg, Face         Bathing assist Assist Level: Supervision/Verbal cueing     Upper Body Dressing/Undressing Upper body dressing   What is the patient wearing?: Pull over shirt    Upper body assist Assist Level: Supervision/Verbal cueing    Lower Body Dressing/Undressing Lower body dressing      What is the patient wearing?: Underwear/pull up, Pants     Lower body assist Assist for lower body dressing: Contact Guard/Touching assist     Toileting Toileting    Toileting assist Assist for toileting: Contact Guard/Touching assist Assistive Device Comment: (bed pan)   Transfers Chair/bed transfer  Transfers assist     Chair/bed transfer assist level: Contact Guard/Touching assist     Locomotion Ambulation   Ambulation assist      Assist level: Minimal Assistance - Patient > 75% Assistive device: Walker-rolling Max distance: 15 ft   Walk 10 feet activity   Assist     Assist level: Minimal Assistance - Patient > 75% Assistive device: Walker-rolling   Walk 50 feet activity   Assist    Assist level: Minimal Assistance - Patient > 75% Assistive device: Walker-rolling    Walk 150 feet activity   Assist Walk 150 feet activity did  not occur: Safety/medical concerns         Walk 10 feet on uneven surface  activity   Assist Walk 10 feet on uneven surfaces activity did not occur: Safety/medical concerns         Wheelchair     Assist Will patient use wheelchair at discharge?: (TBD) Type of Wheelchair: Manual    Wheelchair assist level: Supervision/Verbal cueing Max wheelchair distance: 125 ft    Wheelchair 50 feet with 2 turns  activity    Assist        Assist Level: Supervision/Verbal cueing   Wheelchair 150 feet activity     Assist Wheelchair 150 feet activity did not occur: Safety/medical concerns        Medical Problem List and Plan: 1.Functional and mobility deficitssecondary to respiratory failure and multiple medical issues --Continue CIR therapies including PT, OT, and SLP  2. Antithrombotics: -DVT/anticoagulation: lovenox 40mg  q12             -dopplers negative 4/15 -antiplatelet therapy: asa 325mg  3. Pain Management: tylenol and hydrocodone 4. Mood:  -antipsychotic agents: n/a             -trazodone 25-50mg  qhs for sleep 5. Neuropsych: This patientiscapable of making decisions on hisown behalf. 6. Skin/Wound Care: placed plastic tape and pressure dressing to stoma today---no air leak. Encouraged ongoing pressure to stoma when speaking or bearing down.  7. Fluids/Electrolytes/Nutrition:              -D3 and thins diet currently  -eating very well             -I personally reviewed the patient's labs today.     8. Type 2 DM:  good control at present  -off insulin now  -SSI as needed    9. Pulmonary: decannulated, s/p thoracentesis---negative for malignancy             -oxygen therapy via [place] to keep sats >90%             -pt has not tolerated CPAP---revisit as outpt             -robitussin dm prn for cough  -weaning  oxygen to off as able 10. Atrial fibrillation: amiodarone   - rate controlled 4/18 11. Anemia of chronic disease: hgb 10.6 4/20    LOS: 7 days A FACE TO FACE EVALUATION WAS PERFORMED  Ranelle OysterZachary T Saket Hellstrom 08/22/2018, 9:32 AM

## 2018-08-23 ENCOUNTER — Inpatient Hospital Stay (HOSPITAL_COMMUNITY): Payer: BLUE CROSS/BLUE SHIELD

## 2018-08-23 ENCOUNTER — Inpatient Hospital Stay (HOSPITAL_COMMUNITY): Payer: BLUE CROSS/BLUE SHIELD | Admitting: Physical Therapy

## 2018-08-23 LAB — GLUCOSE, CAPILLARY
Glucose-Capillary: 119 mg/dL — ABNORMAL HIGH (ref 70–99)
Glucose-Capillary: 117 mg/dL — ABNORMAL HIGH (ref 70–99)
Glucose-Capillary: 100 mg/dL — ABNORMAL HIGH (ref 70–99)
Glucose-Capillary: 102 mg/dL — ABNORMAL HIGH (ref 70–99)

## 2018-08-23 NOTE — Progress Notes (Signed)
Nutrition Follow-up  RD working remotely.  INTERVENTION:  - Continue Ensure Enlive po BID, each supplement provides 350 kcal and 20 grams of protein  - Continue MVI with minerals daily  NUTRITION DIAGNOSIS:   Increased nutrient needs related to other (therapies) as evidenced by estimated needs.  Ongoing, being addressed via oral nutrition supplements  GOAL:   Patient will meet greater than or equal to 90% of their needs  Progressing  MONITOR:   PO intake, Supplement acceptance, Labs, Skin, Weight trends  REASON FOR ASSESSMENT:   Malnutrition Screening Tool    ASSESSMENT:   53 year old male with PMH of myotonic muscular dystrophy, PAF, hypothyroidism who was admitted to OSH with A. fib with RVR and 2 to 3 months of DOE/orthopnea and required intubation. Pt was cardioverted and started on amiodarone for heart rate control and treated with sotalol for episode of SVT. Pt failed attempts at extubation therefore required trach on 01/26. Pt was found to have multidrug-resistant Pseudomonas as well as suspicious mass in the chest treated with Zerbaxa. Hospital course significant for eosinophilia as possible side effect of Zyprexa, left chest hemothorax due to ASA, anemia requiring transfusion, dysphagia. PEG tube placed on 01/31. Pt was transferred to [facility] on 2/21 for vent weaning. CT chest done revealing moderate to large bilateral pleural effusions with subtotal collapse of lower lobes and mass worrisome for lung cancer. Pt underwent thoracocentesis of 1 L amber fluid on 2/25 and pathology negative for malignancy. Pt has been weaned off vent and decannulated by 4/10. Pt has been tolerating dysphagia 3, thins without difficulty. Admitted to CIR on 4/13.  Diet advanced to Regular on 4/17. Noted plan for pt to have PEG removed Thursday morning.  No new weights since admission.  Per MAR, pt accepting >90% of Ensure Enlive oral nutrition supplements. Will continue with plan of  care.  Meal Completion: 25-100% x 8 meals (averaging 78%)  Medications reviewed and include: Ensure Enlive BID, MVI with minerals daily, Protonix, vitamin C 500 mg daily  Labs reviewed. CBG's: 117, 119, 129, 104 x 24 hours  UOP: 650 ml x 24 hours I/O's: +1.4 L since admit  Diet Order:   Diet Order            Diet regular Room service appropriate? Yes with Assist; Fluid consistency: Thin  Diet effective 1000              EDUCATION NEEDS:   No education needs have been identified at this time  Skin:  Skin Assessment: Reviewed RN Assessment (open throat anterior - previous trach site)  Last BM:  08/23/18 medium type 4  Height:   Ht Readings from Last 1 Encounters:  08/15/18 6\' 3"  (1.905 m)    Weight:   Wt Readings from Last 1 Encounters:  08/15/18 86.4 kg    Ideal Body Weight:  89.1 kg  BMI:  Body mass index is 23.81 kg/m.  Estimated Nutritional Needs:   Kcal:  2200-2400  Protein:  110-125 grams  Fluid:  >/= 2.0 L    Earma Reading, MS, RD, LDN Inpatient Clinical Dietitian Pager: [phone] Weekend/After Hours: [phone]

## 2018-08-23 NOTE — Progress Notes (Signed)
Slept well throughout the night. No complaints verbalized.  

## 2018-08-23 NOTE — Progress Notes (Signed)
Physical Therapy Session Note  Patient Details  Name: Luis Choi MRN: [id number] Date of Birth: [birth year]  Today's Date: 08/23/2018 PT Individual Time: [id number] and 6759-1638 PT Individual Time Calculation (min): 69 min and 30 min  Short Term Goals: Week 2:  PT Short Term Goal 1 (Week 2): STG = LTG due to estimated d/c date.  Skilled Therapeutic Interventions/Progress Updates:  Treatment 1: Pt received in bed & agreeable to tx reporting need to use restroom. Pt transfers to sitting EOB with mod I with hospital bed features, and sit<>stand with CGA multiple attempts to transfer to standing from low bed & poor eccentric control when sitting on low toilet. Pt ambulates in room<>bathroom with RW & CGA<>min assist (when negotiating bathroom threshold) but slower and with more safety awareness on this date. Pt unable to have BM on toilet, instead only having continent void with extra time. Pt performs hand hygiene standing at sink with CGA for balance. Pt dons shorts from sitting/standing EOB with close supervision<>CGA. Pt transfers to w/c via ambulation with RW & CGA. Pt with pictures of home entrance on phone and it seems feasible that pt can hold to door frame to negotiate 2 steps into house with wife assisting with placing RW up/down steps. In dayroom, pt ambulates 90 ft + 60 ft + 122 ft with RW & CGA<>close supervision with 1 instance of partial R knee buckling but no LOB noted. Pt negotiates steps with BUE on rails to simulate pt holding to doorframe with pt negotiating 8 steps + 2 steps (3") with min assist. Pt propels w/c back to room with BUE & supervision for cardiopulmonary endurance training. Pt performs 5x sit<>stand with 1 UE support on recliner for BLE strengthening. Pt left sitting in recliner with chair alarm donned & all needs at hand.  Pt on room air during session & SpO2 remained >90%. Pt denies c/o pain.    Treatment 2: Pt received in bed & agreeable to tx, denying c/o pain.  Educated pt on estimated d/c date. Pt transfers supine<>sitting with hospital bed features & mod I. Pt transfers sit<>stand during session with supervision. Pt ambulates 10 ft + 10 ft + 200 ft with RW & supervision with decreased dorsiflexion BLE, decreased heel strike BLE, and continued support from RW; SpO2 = 94% after long walk, HR = 114 bpm. Pt completes car transfer at [employer] simulated height with supervision overall. Supine in bed pt performs BLE bridging (2 sets x 10 reps) with supervision with inability to fully extend B hips and task focusing on glute/hamstring strengthening. At end of session pt left in bed with alarm set & all needs at hand.    Therapy Documentation Precautions:  Precautions Precautions: Fall Restrictions Weight Bearing Restrictions: No Other Position/Activity Restrictions: PEG tube    Therapy/Group: Individual Therapy  Sandi Mariscal 08/23/2018, 3:30 PM

## 2018-08-23 NOTE — Progress Notes (Signed)
Occupational Therapy Weekly Progress Note  Patient Details  Name: Luis Choi MRN: [id number] Date of Birth: 11/29/1965  Beginning of progress report period: August 16, 2018 End of progress report period: August 23, 2018  Today's Date: 08/23/2018 OT Individual Time: 9024-0973 OT Individual Time Calculation (min): 75 min    Patient has met 4 of 4 short term goals.  Pt has made great improvements in OT during the last week. He is now able to consistently complete ADLs with CGA. He uses AE appropriately during LB dressing when needed. Pt is able to self-monitor need for rest breaks and demonstrates improved safety awareness. Pt still requires frequent cueing for stoma occlusion during talking and coughing, and has poor activity tolerance overall.   Patient continues to demonstrate the following deficits: muscle weakness, decreased oxygen support, decreased problem solving and decreased standing balance and decreased postural control and therefore will continue to benefit from skilled OT intervention to enhance overall performance with BADL.  Patient progressing toward long term goals..  Continue plan of care.   OT Short Term Goals Week 1:  OT Short Term Goal 1 (Week 1): Pt will maintain SpO2 > 94% on RA during 1 standing level grooming task OT Short Term Goal 1 - Progress (Week 1): Met OT Short Term Goal 2 (Week 1): Pt will don pants using LRAD with CGA OT Short Term Goal 2 - Progress (Week 1): Met OT Short Term Goal 3 (Week 1): Pt will don B socks with LRAD with CGA OT Short Term Goal 3 - Progress (Week 1): Met OT Short Term Goal 4 (Week 1): Pt will transfer to comfort height toilet with CGA  OT Short Term Goal 4 - Progress (Week 1): Met Week 2:  OT Short Term Goal 1 (Week 2): STG=LTG d/t ELOS  Skilled Therapeutic Interventions/Progress Updates:    Pt received supine in bed with no c/o pain. Pt completed bed mobility, transitioning to EOB with (S) using bed features. Pt completed sit >  stand with RW with close (S) initially, however when he returned to sitting again to doff pants, he required min A to power up again. Pt completed functional mobility into bathroom with RW with CGA. Edu/cueing re safety with doffing LB clothing provided. Pt transferred onto [facility] in walk in shower with CGA.  Peg and stoma were occluded. Pt completed lateral leans to wash buttocks and stood with CGA to wash anterior peri area. Pt transferred out of shower to EOB with CGA. Pt able to don all clothing, CGA for LB in standing. Pt propelled w/c 200 ft with increased time and required rest break following. Demonstrated walk in shower technique with RW and potential piece of AE. Pt and OT agreed on shower chair with back as medically necessary DME for shower. Pt returned demonstration and completed walk in shower transfer with CGA. Pt now in therapy gym sitting EOM. Pt completed 3x 10 mini squats to increase initiation of ADL transfers. Pt c/o fatigue and requested to return to room. Pt used RW to transfer back to bed, CGA, and was left supine with all needs met.   Therapy Documentation Precautions:  Precautions Precautions: Fall Restrictions Weight Bearing Restrictions: No Other Position/Activity Restrictions: PEG tube    Pain: Pain Assessment Pain Score: 2  Pain Type: Acute pain Pain Location: Abdomen(peg tube site) Pain Orientation: Left Pain Descriptors / Indicators: Tender Pain Onset: On-going Pain Intervention(s): Repositioned   Therapy/Group: Individual Therapy  Curtis Sites 08/23/2018, 11:48 AM

## 2018-08-23 NOTE — Progress Notes (Addendum)
[place] [facility] PHYSICAL MEDICINE & REHABILITATION PROGRESS NOTE   Subjective/Complaints: No new issues. Says that pink tape is "leaking". Otherwise doing well. Pain seems controlled  ROS: Patient denies fever, rash, sore throat, blurred vision, nausea, vomiting, diarrhea, cough, shortness of breath or chest pain, joint or back pain, headache, or mood change.  .   Objective:   No results found. Recent Labs    08/22/18 0809  WBC 9.6  HGB 10.6*  HCT 34.8*  PLT 394   Recent Labs    08/22/18 0809  NA 142  K 4.1  CL 104  CO2 27  GLUCOSE 138*  BUN 14  CREATININE 0.83  CALCIUM 9.0    Intake/Output Summary (Last 24 hours) at 08/23/2018 [id number] Last data filed at 08/23/2018 [id number] Gross per 24 hour  Intake 722 ml  Output 600 ml  Net 122 ml     Physical Exam: Vital Signs Blood pressure 94/60, pulse 89, temperature 98.1 F (36.7 C), temperature source Oral, resp. rate 17, height 6\' 3"  (1.905 m), weight 86.4 kg, SpO2 91 %.  Constitutional: No distress . Vital signs reviewed. HEENT: EOMI, oral membranes moist Neck: supple, stoma dressed. Voice stronger. Less air leak Cardiovascular: RRR without murmur. No JVD    Respiratory: CTA Bilaterally without wheezes or rales. Normal effort    GI: BS +, non-tender, non-distended  Musculoskeletal:  General: No edema.  Neurological: He isalertand oriented to person, place, and time. Reasonable insight.   Normal language, follows all commands. UE 4-5/5 prox to distal except for 3/5 strength in HI right more preserved than left. LE: 3+-4/5 .   Skin: Skin iswarmand dry.  Psychiatric: flat  Assessment/Plan: 1. Functional deficits secondary to debility after multiple medical which require 3+ hours per day of interdisciplinary therapy in a comprehensive inpatient rehab setting.  Physiatrist is providing close team supervision and 24 hour management of active medical problems listed below.  Physiatrist and rehab team continue to  assess barriers to discharge/monitor patient progress toward functional and medical goals  Care Tool:  Bathing  Bathing activity did not occur: Safety/medical concerns Body parts bathed by patient: Right arm, Right upper leg, Left arm, Left upper leg, Abdomen, Right lower leg, Chest, Front perineal area, Left lower leg, Face         Bathing assist Assist Level: Supervision/Verbal cueing     Upper Body Dressing/Undressing Upper body dressing   What is the patient wearing?: Pull over shirt    Upper body assist Assist Level: Supervision/Verbal cueing    Lower Body Dressing/Undressing Lower body dressing      What is the patient wearing?: Underwear/pull up, Pants     Lower body assist Assist for lower body dressing: Contact Guard/Touching assist     Toileting Toileting    Toileting assist Assist for toileting: Contact Guard/Touching assist Assistive Device Comment: (bed pan)   Transfers Chair/bed transfer  Transfers assist     Chair/bed transfer assist level: Minimal Assistance - Patient > 75%     Locomotion Ambulation   Ambulation assist      Assist level: Minimal Assistance - Patient > 75% Assistive device: Walker-rolling Max distance: 70 ft   Walk 10 feet activity   Assist     Assist level: Minimal Assistance - Patient > 75% Assistive device: Walker-rolling   Walk 50 feet activity   Assist    Assist level: Minimal Assistance - Patient > 75% Assistive device: Walker-rolling    Walk 150 feet activity   Assist Walk  150 feet activity did not occur: Safety/medical concerns         Walk 10 feet on uneven surface  activity   Assist Walk 10 feet on uneven surfaces activity did not occur: Safety/medical concerns         Wheelchair     Assist Will patient use wheelchair at discharge?: (TBD) Type of Wheelchair: Manual    Wheelchair assist level: Supervision/Verbal cueing Max wheelchair distance: 125 ft    Wheelchair 50  feet with 2 turns activity    Assist        Assist Level: Supervision/Verbal cueing   Wheelchair 150 feet activity     Assist Wheelchair 150 feet activity did not occur: Safety/medical concerns        Medical Problem List and Plan: 1.Functional and mobility deficitssecondary to respiratory failure and multiple medical issues --Continue CIR therapies including PT, OT, and SLP  -team conference today  2. Antithrombotics: -DVT/anticoagulation: lovenox 40mg  q12             -dopplers negative 4/15 -antiplatelet therapy: asa 325mg  3. Pain Management: tylenol and hydrocodone 4. Mood:  -antipsychotic agents: n/a             -trazodone 25-50mg  qhs for sleep 5. Neuropsych: This patientiscapable of making decisions on hisown behalf. 6. Skin/Wound Care: continue plastic tape as long as possible  -finger occlusion/pressure to trach stoma  -appears to be closing 7. Fluids/Electrolytes/Nutrition:              -D3 and thins diet currently  -eating very well             -remove PEG Thursday MORNING   8. Type 2 DM:  good control at present  -dc lantus tonight (did not yesterday)       9. Pulmonary: decannulated, s/p thoracentesis---negative for malignancy             -oxygen therapy via [place] to keep sats >90%             -pt has not tolerated CPAP---revisit as outpt             -robitussin dm prn for cough  -weaning  oxygen to off  10. Atrial fibrillation: amiodarone   - rate controlled 4/18 11. Anemia of chronic disease: hgb 10.6 4/20    LOS: 8 days A FACE TO FACE EVALUATION WAS PERFORMED  Ranelle Oyster 08/23/2018, 8:33 AM

## 2018-08-23 NOTE — Progress Notes (Signed)
Physical Therapy Session Note  Patient Details  Name: Luis Choi MRN: [id number] Date of Birth: October 20, 1965  Today's Date: 08/23/2018 PT Individual Time: 1115-1145 PT Individual Time Calculation (min): 30 min   Short Term Goals: Week 1:  PT Short Term Goal 1 (Week 2): STG = LTG due to estimated d/c date.  Skilled Therapeutic Interventions/Progress Updates:    Patient seated in recliner and reports activities already performed.  Patient performed seated and standing therex as noted below.  Patient requested back to bed end of session so sit to stand with S and ambulated around bed in room with RW and S.  Sit to supine mod I.  Left with call bell and needs in reach and bed alarm activated.   Therapy Documentation Precautions:  Precautions Precautions: Fall Restrictions Weight Bearing Restrictions: No Other Position/Activity Restrictions: PEG tube Pain: Pain Assessment Pain Score: 2  Pain Type: Acute pain Pain Location: Abdomen(peg tube site) Pain Orientation: Left Pain Descriptors / Indicators: Tender Pain Onset: On-going Pain Intervention(s): Repositioned Exercises: General Exercises - Lower Extremity Ankle Circles/Pumps: Both;20 reps;Seated Long Arc Quad: Strengthening;Both;20 reps;Seated Straight Leg Raises: Strengthening;5 reps;15 reps;Seated Hip Flexion/Marching: Strengthening;Both;15 reps;Standing Mini-Sqauts: Strengthening;Both;Standing;15 reps Total Joint Exercises Hip ABduction/ADduction: Strengthening;Both;15 reps;Standing Standing Hip Extension: Strengthening;Both;15 reps;Standing Other Exercises Other Exercises: seated rows x 15 Other Exercises: seated trunk flexion lifting off back of chair without Ue support x 20    Therapy/Group: Individual Therapy  Elray Mcgregor  Sheran Lawless, PT 08/23/2018, 11:47 AM

## 2018-08-23 NOTE — Progress Notes (Addendum)
Physical Therapy Weekly Progress Note  Patient Details  Name: Luis Choi MRN: [id number] Date of Birth: May 02, 1966  Beginning of progress report period: August 16, 2018 End of progress report period: August 23, 2018  Today's Date: 08/23/2018   Patient has met 0 of 4 short term goals.  Pt is making consistent progress towards all LTG's. Pt currently completes bed mobility with hospital bed features with supervision progressing towards mod I. Pt requires min assist for transfers and gait up to 70 ft with RW. Have initiated stair training to simulate pt's home entrance, (2 steps, 3" height without rails) backwards with RW with pt requiring min assist - will need to finalize stair negotiation & safety with task prior to pt's d/c home. Pt is only able to tolerate standing <2 minutes at a time 2/2 fatigue & SOB but is able to remain on room air with cuing for pursed lip breathing with activity. Pt would benefit from continued skilled PT treatment to focus on transfers, balance, standing tolerance, gait, stair negotiation, strengthening & endurance as well as education regarding d/c.   Patient continues to demonstrate the following deficits muscle weakness, decreased cardiorespiratoy endurance, decreased safety awareness, and decreased standing balance, decreased postural control and decreased balance strategies and therefore will continue to benefit from skilled PT intervention to increase functional independence with mobility.  Patient progressing toward long term goals..  Continue plan of care.  PT Short Term Goals Week 1:  PT Short Term Goal 1 (Week 1): Pt will ambulate ~181f using LRAD with no more than CGA PT Short Term Goal 1 - Progress (Week 1): Not met PT Short Term Goal 2 (Week 1): Pt will perform sit<>stand using LRAD with no more than CGA PT Short Term Goal 2 - Progress (Week 1): Progressing toward goal PT Short Term Goal 3 (Week 1): Pt will performed bed<>chair transfer using LRAD with  no more than CGA PT Short Term Goal 3 - Progress (Week 1): Progressing toward goal PT Short Term Goal 4 (Week 1): Pt will ascend/descend 4 steps using unilateral handrail with no more than min assist PT Short Term Goal 4 - Progress (Week 1): Not met Week 2:  PT Short Term Goal 1 (Week 2): STG = LTG due to estimated d/c date.   Therapy Documentation Precautions:  Precautions Precautions: Fall Restrictions Weight Bearing Restrictions: No Other Position/Activity Restrictions: PEG tube    Therapy/Group: Individual Therapy  VWaunita Schooner4/21/2020, 7:59 AM

## 2018-08-24 ENCOUNTER — Inpatient Hospital Stay (HOSPITAL_COMMUNITY): Payer: BLUE CROSS/BLUE SHIELD | Admitting: Physical Therapy

## 2018-08-24 ENCOUNTER — Inpatient Hospital Stay (HOSPITAL_COMMUNITY): Payer: BLUE CROSS/BLUE SHIELD

## 2018-08-24 ENCOUNTER — Inpatient Hospital Stay (HOSPITAL_COMMUNITY): Payer: BLUE CROSS/BLUE SHIELD | Admitting: *Deleted

## 2018-08-24 LAB — GLUCOSE, CAPILLARY
Glucose-Capillary: 103 mg/dL — ABNORMAL HIGH (ref 70–99)
Glucose-Capillary: 105 mg/dL — ABNORMAL HIGH (ref 70–99)
Glucose-Capillary: 123 mg/dL — ABNORMAL HIGH (ref 70–99)
Glucose-Capillary: 119 mg/dL — ABNORMAL HIGH (ref 70–99)

## 2018-08-24 NOTE — Progress Notes (Signed)
[facility] PHYSICAL MEDICINE & REHABILITATION PROGRESS NOTE   Subjective/Complaints:  no new isues. Trach still with leak.  ROS: Patient denies fever, rash, sore throat, blurred vision, nausea, vomiting, diarrhea, cough, shortness of breath or chest pain, joint or back pain, headache, or mood change.   .   Objective:   No results found. Recent Labs    08/22/18 0809  WBC 9.6  HGB 10.6*  HCT 34.8*  PLT 394   Recent Labs    08/22/18 0809  NA 142  K 4.1  CL 104  CO2 27  GLUCOSE 138*  BUN 14  CREATININE 0.83  CALCIUM 9.0    Intake/Output Summary (Last 24 hours) at 08/24/2018 0859 Last data filed at 08/24/2018 0545 Gross per 24 hour  Intake 444 ml  Output 600 ml  Net -156 ml     Physical Exam: Vital Signs Blood pressure 94/72, pulse 86, temperature 98.6 F (37 C), temperature source Oral, resp. rate 18, height 6\' 3"  (1.905 m), weight 86.4 kg, SpO2 91 %.  Constitutional: No distress . Vital signs reviewed. HEENT: EOMI, oral membranes moist Neck: supple, trach with 1-75mm opening Cardiovascular: RRR without murmur. No JVD    Respiratory: CTA Bilaterally without wheezes or rales. Normal effort    GI: BS +, non-tender, non-distended  Musculoskeletal:  General: No edema.  Neurological: He isalertand oriented to person, place, and time. Reasonable insight.   Normal language, follows all commands. UE 4-5/5 prox to distal except for 3/5 strength in HI right more preserved than left. LE: 3+-4/5 .   Skin: Skin iswarmand dry.  Psychiatric: flat  Assessment/Plan: 1. Functional deficits secondary to debility after multiple medical which require 3+ hours per day of interdisciplinary therapy in a comprehensive inpatient rehab setting.  Physiatrist is providing close team supervision and 24 hour management of active medical problems listed below.  Physiatrist and rehab team continue to assess barriers to discharge/monitor patient progress toward functional and  medical goals  Care Tool:  Bathing  Bathing activity did not occur: Safety/medical concerns Body parts bathed by patient: Right arm, Right upper leg, Left arm, Left upper leg, Abdomen, Right lower leg, Chest, Front perineal area, Left lower leg, Face, Buttocks         Bathing assist Assist Level: Contact Guard/Touching assist     Upper Body Dressing/Undressing Upper body dressing   What is the patient wearing?: Pull over shirt    Upper body assist Assist Level: Supervision/Verbal cueing    Lower Body Dressing/Undressing Lower body dressing      What is the patient wearing?: Underwear/pull up, Pants     Lower body assist Assist for lower body dressing: Contact Guard/Touching assist     Toileting Toileting    Toileting assist Assist for toileting: Contact Guard/Touching assist Assistive Device Comment: (bed pan)   Transfers Chair/bed transfer  Transfers assist     Chair/bed transfer assist level: Supervision/Verbal cueing     Locomotion Ambulation   Ambulation assist      Assist level: Supervision/Verbal cueing Assistive device: Walker-rolling Max distance: 15'   Walk 10 feet activity   Assist     Assist level: Supervision/Verbal cueing Assistive device: Walker-rolling   Walk 50 feet activity   Assist    Assist level: Supervision/Verbal cueing Assistive device: Walker-rolling    Walk 150 feet activity   Assist Walk 150 feet activity did not occur: Safety/medical concerns  Assist level: Supervision/Verbal cueing Assistive device: Walker-rolling    Walk 10 feet on uneven  surface  activity   Assist Walk 10 feet on uneven surfaces activity did not occur: Safety/medical concerns         Wheelchair     Assist Will patient use wheelchair at discharge?: (TBD) Type of Wheelchair: Manual    Wheelchair assist level: Supervision/Verbal cueing Max wheelchair distance: 150 ft    Wheelchair 50 feet with 2 turns  activity    Assist        Assist Level: Supervision/Verbal cueing   Wheelchair 150 feet activity     Assist Wheelchair 150 feet activity did not occur: Safety/medical concerns   Assist Level: Supervision/Verbal cueing    Medical Problem List and Plan: 1.Functional and mobility deficitssecondary to respiratory failure and multiple medical issues --Continue CIR therapies including PT, OT, and SLP    2. Antithrombotics: -DVT/anticoagulation: lovenox 40mg  q12             -dopplers negative 4/15 -antiplatelet therapy: asa 325mg  3. Pain Management: tylenol and hydrocodone 4. Mood:  -antipsychotic agents: n/a             -trazodone 25-50mg  qhs for sleep 5. Neuropsych: This patientiscapable of making decisions on hisown behalf. 6. Skin/Wound Care:  stoma still open  -finger occlusion/pressure to trach stoma  -daily dressing 7. Fluids/Electrolytes/Nutrition:              -D3 and thins diet currently  -eating very well             -remove PEG Thursday MORNING   8. Type 2 DM:  good control at present  -off insulin       9. Pulmonary: decannulated, s/p thoracentesis---negative for malignancy             -oxygen therapy via [place] to keep sats >90%             -pt has not tolerated CPAP---revisit as outpt             -robitussin dm prn for cough  -weaning  oxygen  off  10. Atrial fibrillation: amiodarone   - rate controlled 4/22 11. Anemia of chronic disease: hgb 10.6 4/20    LOS: 9 days A FACE TO FACE EVALUATION WAS PERFORMED  Ranelle OysterZachary T Swartz 08/24/2018, 8:59 AM

## 2018-08-24 NOTE — Consult Note (Signed)
WOC Nurse wound consult note Patient receiving care in [facility] [phone]. Reason for Consult: slow to heal trach opening Wound type: surgical Wound bed: open Drainage (amount, consistency, odor) clear.  At the time of my assessment the wound had gauze and pink tape that is harsh on the skin and uncomfortable to the patient. Periwound: intact Dressing procedure/placement/frequency: Cleanse with soap and water. Pat dry. Apply Allevyn Hart [place] # [phone]) daily. Thank you for the consult.  Discussed plan of care with the patient and bedside nurse.  WOC nurse will not follow at this time.  Please re-consult the WOC team if needed.  Helmut Muster, RN, MSN, CWOCN, CNS-BC, pager [phone]

## 2018-08-24 NOTE — Progress Notes (Signed)
Physical Therapy Session Note  Patient Details  Name: Luis Choi MRN: [id number] Date of Birth: 1965/07/06  Today's Date: 08/24/2018 PT Individual Time: [phone] and [id number] PT Individual Time Calculation (min): 54 min and 38 min  Short Term Goals: Week 2:  PT Short Term Goal 1 (Week 2): STG = LTG due to estimated d/c date.  Skilled Therapeutic Interventions/Progress Updates:  Treatment 1: Pt received in bed & agreeable to tx. Pt denies c/o pian & pt on room air throughout session with SpO2 remaining >90%. Pt requests to stay close to bathroom as he may need to go soon. Pt completes bed mobility without rails with mod I. Pt transfers sit<>stand with supervision. Pt engaged in the following standing balance & tolerance tasks: standing with narrow BOS with CGA (1 minute + 1 min 30 sec).  Pt reports feeling hot during activity & when returned to sitting, BP = 84/73 mmHg (RUE) but when rechecked BP = 102/79 mmHg (RUE). Pt returned to standing & BP = 85/61 mmHg (RUE). Pt returned to supine to rest, therapist donned B ted hose to assist with BP management & BP = 90/74 mmHg (RUE). Back to sitting EOB BP = 90/72 mmHg (RUE), standing BP = 83/61 mmHg (RUE). Pt requests to walk to door & back to bed & does so with RW & CGA; afterwards standing BP = 100/64 mmHg (RUE), HR = 130 bpm. Pt performed tandem stance without BUE support & min assist for balance to focus on standing balance with pt only able to maintain balance <15 seconds at a time. Pt performed single leg stance with 1 UE support and min increasing to mod assist for standing balance with pt only able to maintain position <10 seconds 2/2 LE weakness & impaired balance. Pt ambulates 40 ft with RW and CGA. At end of session pt left in bed with alarm set & all needs at hand.  Treatment 2: Pt received in bed & agreeable to tx. Pt denied c/o pain. Pt transfers supine<>sitting with mod I and sit<>Stand with supervision throughout session from various  surfaces (EOB, nu-step, kinetron, standard chair without armrests). Pt ambulates room<>dayroom with RW & supervision. Pt utilized nu-step on level 3 x 10 minutes with BLE only with task focusing on BLE strengthening & endurance training. Pt utilized kinetron in standing with BUE support and task focusing on BLE strengthening with pt unable to fully extend knees & hips during task. Pt ambulates 40 ft with RW & CGA 2/2 BLE fatigue, and required up to CGA when returning to room 2/2 fatigue. At end of session pt left in bed with alarm set & all needs at hand.  Therapy Documentation Precautions:  Precautions Precautions: Fall Restrictions Weight Bearing Restrictions: No Other Position/Activity Restrictions: PEG tube    Therapy/Group: Individual Therapy  Sandi Mariscal 08/24/2018, 1:48 PM

## 2018-08-24 NOTE — Patient Care Conference (Signed)
Inpatient RehabilitationTeam Conference and Plan of Care Update Date: 08/23/2018   Time: 2:35 PM    Patient Name: Luis DixonGregory F Pettey      Medical Record Number: [id number]  Date of Birth: 1966-02-25 Sex: Male         Room/Bed: 4W08C/4W08C-01 Payor Info: Payor: BLUE CROSS BLUE SHIELD / Plan: BCBS OTHER / Product Type: *No Product type* /    Admitting Diagnosis: debility  Admit Date/Time:  08/15/2018  2:49 PM Admission Comments: No comment available   Primary Diagnosis:  <principal problem not specified> Principal Problem: <principal problem not specified>  Patient Active Problem List   Diagnosis Date Noted  . Debility 08/15/2018  . Acute on chronic respiratory failure with hypoxia (HCC)   . Chronic atrial fibrillation   . Pneumonia of both lungs due to Pseudomonas species (HCC)   . Pleural effusion, left   . 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 Faith RogueZachary Swartz Social Worker Present: Amada JupiterLucy Evalin Shawhan, LCSW Nurse Present: Chana Bodeeborah Sharp, RN PT Present: Aleda GranaVictoria Miller, PT OT Present: Other (comment)(Sandra Earlene Plateravis, OT) SLP Present: Feliberto Gottronourtney Payne, SLP PPS Coordinator present : Fae PippinMelissa Bowie     Current Status/Progress Goal Weekly Team Focus  Medical   trach still leaking. peg in. still on [place] oxygen.  improve breathing techniques and stamina  nutrition/PEG/trach care   Bowel/Bladder   Continent of bowel and bladder LBM 08/22/18  Maintain coninence of B/B  Assess toileting needs and assist prn   Swallow/Nutrition/ Hydration             ADL's   CGA ADL transfers, CGA bathing and dressing   (S) overall  LB ADLs, functional activity tolerance, ADL transfers   Mobility   min assist transfers, supervision bed mobility, min assist short distance gait with RW, min assist stairs with B rails or RW  supervision overall  transfers, gait, stairs, balance, endurance, bed mobility    Communication             Safety/Cognition/ Behavioral Observations            Pain   no c/o pain  Remain pain free  Assess pain every shift and prn   Skin   MASD to perineum and bottom-barrier cream, trach ostomy dry occlusive dressing intact  No s/sx of infection, promote wound healing, keep perineum clean and dry  Assess skin every shift and prn    Rehab Goals Patient on target to meet rehab goals: Yes *See Care Plan and progress notes for long and short-term goals.     Barriers to Discharge  Current Status/Progress Possible Resolutions Date Resolved   Physician    Medical stability        continue medical plan as outlined in the chart      Nursing                  PT  Inaccessible home environment;Decreased caregiver support;Home environment access/layout;Lack of/limited family support  steps to enter house without rails, unsure if wife can provide 24 hr supervision              OT                  SLP                SW                Discharge Planning/Teaching  Needs:  pt to return home with his wife who can take some time from work.  She works days.    Teaching needs TBD   Team Discussion:  Trach stoma still needs consistent coverage and pt needs cues for this.  Good nutrition and plan to remove peg on Thursday.  Off O2 except at night but should be weaned.  CGA with ADLs and CGA - min with mobility.  Easily fatigues.    Revisions to Treatment Plan:  NA    Continued Need for Acute Rehabilitation Level of Care: The patient requires daily medical management by a physician with specialized training in physical medicine and rehabilitation for the following conditions: Daily direction of a multidisciplinary physical rehabilitation program to ensure safe treatment while eliciting the highest outcome that is of practical value to the patient.: Yes Daily medical management of patient stability for increased activity during participation in an intensive rehabilitation  regime.: Yes Daily analysis of laboratory values and/or radiology reports with any subsequent need for medication adjustment of medical intervention for : Post surgical problems;Pulmonary problems;Wound care problems   I attest that I was present, lead the team conference, and concur with the assessment and plan of the team.   Amada Jupiter 08/24/2018, 10:52 AM     Team conference was held via web/ teleconference due to COVID - 19

## 2018-08-24 NOTE — Progress Notes (Signed)
Social Work Patient ID: Luis Choi, male   DOB: 12/05/1965, 53 y.o.   MRN: [id number]  Have reviewed team conference with patient and wife.  Both aware and VERY pleased with targeted d/c of 4/25 and supervision goals overall. Still need to confirm if family ed needed with team.  Continue to follow.  Mylie Mccurley, LCSW

## 2018-08-24 NOTE — Evaluation (Signed)
Recreational Therapy Assessment and Plan  Patient Details  Name: Luis Choi MRN: [id number] Date of Birth: November 30, 1965 Today's Date: 08/24/2018  Rehab Potential: Good ELOS: 4/25  Assessment Problem List:      Patient Active Problem List   Diagnosis Date Noted  . Debility 08/15/2018  . Acute on chronic respiratory failure with hypoxia ([place])   . Chronic atrial fibrillation   . Pneumonia of both lungs due to Pseudomonas species ([place])   . Pleural effusion, left   . Muscular dystrophy [facility])     Past Medical History:      Past Medical History:  Diagnosis Date  . Acute on chronic respiratory failure with hypoxia ([place])   . Chronic atrial fibrillation   . Hepatitis A   . Muscular dystrophy ([place])   . Pleural effusion, left   . Pneumonia of both lungs due to Pseudomonas species [facility])    Past Surgical History:       Past Surgical History:  Procedure Laterality Date  . CATARACT EXTRACTION, BILATERAL  2018   with implants  . CHOLECYSTECTOMY  2010   CBD cyst with enlarged duct/cyst  . COLONOSCOPY W/ POLYPECTOMY    . PANCREATIC CYST DRAINAGE  2010   due trauma  . THYROIDECTOMY, PARTIAL Right 12/2009    Assessment & Plan Clinical Impression: Patient is a 53 y.o. year old male with history of myotonic muscular dystrophy, PAF, hypothyroidism who was admitted to OSH with A. fib with RVR, 2 to 3 months of DOE/orthopnea and required intubation. He was cardioverted and started onamiodarone for heart rate controland treated with sotalol for episode ofSVT. He failed attempts at extubation therefore required tracheostomy on 05/29/18. He was found to have multidrug-resistant Pseudomonas as well as suspicious mass in the chest treated with Zerbaxa.Hospital course significant for eosinophilia as possible side effect of Zyprexa, left chest hemothorax due to ASA ,anemia requiring transfusion, dysphagia --PEG tube placed on 06/03/18 andmild addisonian crisis  requiring stress dose steroids. He was transferred to [facility] on 06/24/2018 for vent weaning. Repeat sputum cultures showed ESBL pseudomonas and Klebsiella and antibiotics changed to Gentamicin/cipro. CT chest done revealing moderate to large bilateral pleural effusions with subtotal collapse of lower lobes and 3 cm X 1.3 cm X 1.5 cm mass in RML worrisome for lung CA and 1.2 cm mass in RUL- inflammatory v/s neoplastic. He underwent thoracocentesis of 1 L amber fluid on 02/25 and pathology negative for malignancy. He has been weaned off vent-->BIPAP and decannulated by 04/10. Has been tolerating dysphagia 3, thins without difficulty. Respiratory status has improved and he is showing improvement in activity tolerance but noted to be deconditioned. CIR recommended due to functional decline. Patient transferred to CIR on 08/15/2018.   Pt presents with decreased activity tolerance, decreased functional mobility, decreased balance Limiting pt's independence with leisure/community pursuits. Met with pt to discuss leisure interests, provide education on activity analysis with potential modifications.  Pt shared leisure interest, most of which revolved around his 2 kids, discussed what he is looking forward to about upcoming discharge.    Leisure History/Participation Premorbid leisure interest/current participation: Sports - Lexicographer - Travel (Comment)(spending time with kids) Other Leisure Interests: Television Leisure Participation Style: With Family/Friends Awareness of Community Resources: Fair-identify 2 post discharge leisure resources Psychosocial / Spiritual Social interaction - Mood/Behavior: Cooperative Academic librarian Appropriate for Education?: Yes Strengths/Weaknesses Patient Strengths/Abilities: Willingness to participate;Active premorbidly Patient weaknesses: Physical limitations TR Patient demonstrates impairments in the following area(s): Endurance;Motor;Safety  Plan   No  further  TR as pt is expected to discharge on 4/25.    Recommendations for other services: None   Discharge Criteria: Patient will be discharged from TR if patient refuses treatment 3 consecutive times without medical reason.  If treatment goals not met, if there is a change in medical status, if patient makes no progress towards goals or if patient is discharged from hospital.  The above assessment, treatment plan, treatment alternatives and goals were discussed and mutually agreed upon: by patient  [place] 08/24/2018, 12:46 PM

## 2018-08-24 NOTE — Progress Notes (Signed)
Occupational Therapy Session Note  Patient Details  Name: Luis Choi MRN: [id number] Date of Birth: [birth year]  Today's Date: 08/24/2018 OT Individual Time: 6619-6940 Session 2: 9828-6751 OT Individual Time Calculation (min): 75 min Session 2: 30 min   Short Term Goals: Week 2:  OT Short Term Goal 1 (Week 2): STG=LTG d/t ELOS  Skilled Therapeutic Interventions/Progress Updates:    Pt received supine with no c/o pain. Pt initially eating breakfast, chatting with OT re d/c planning and return to activity. Pt completed bed mobility to EOB with mod I. Pt completed sit > stand with (S). Pt completed functional mobility to sink and stood to complete oral care with close (S). Pt propelled w/c 150 ft to therapy gym with increased time and 2 rest breaks. Pt transferred to therapy mat and sat EOM with no LOB. Pt completed distal reaching to ground level in standing with unilateral support on RW. CGA provided throughout for safety. Pt then completed bimanual coordination task, dynamically in standing with several LOB, requiring min A to correct. Pt completed an overhead BUE coordination task to challenge BUE endurance in standing. Pt required frequent rest breaks d/t fatigue. Frequent cues required for occlusion of stoma throughout session. Pt returned to room and left supine with all needs met.   Session 2: Session focused on bathing and dressing at shower level. Pt completed functional mobility with RW into bathroom, first transferring to toilet with (S), sitting a bit abruptly but pt aware. Pt voided urine only and transferred to shower chair. Cueing provided for safety awareness and RW management during transfer. Peg and stoma occluded for shower. Pt sat on BSC in shower and completed all bathing with (S). Pt returned to EOB and donned all clothing with (S). Pt demonstrated improved sit <> stand transfers overall this session. Pt left supine with all needs met.   Therapy Documentation Precautions:   Precautions Precautions: Fall Restrictions Weight Bearing Restrictions: No Other Position/Activity Restrictions: PEG tube Pain:  No c/o pain   Therapy/Group: Individual Therapy  Curtis Sites 08/24/2018, 7:10 AM

## 2018-08-25 ENCOUNTER — Inpatient Hospital Stay (HOSPITAL_COMMUNITY): Payer: BLUE CROSS/BLUE SHIELD

## 2018-08-25 LAB — GLUCOSE, CAPILLARY
Glucose-Capillary: 108 mg/dL — ABNORMAL HIGH (ref 70–99)
Glucose-Capillary: 104 mg/dL — ABNORMAL HIGH (ref 70–99)
Glucose-Capillary: 107 mg/dL — ABNORMAL HIGH (ref 70–99)
Glucose-Capillary: 103 mg/dL — ABNORMAL HIGH (ref 70–99)

## 2018-08-25 NOTE — Progress Notes (Signed)
Physical Therapy Session Note  Patient Details  Name: Luis Choi MRN: [id number] Date of Birth: 19-Mar-1966  Today's Date: 08/25/2018 PT Individual Time: 1015-1100 PT Individual Time Calculation (min): 45 min   Short Term Goals: Week 2:  PT Short Term Goal 1 (Week 2): STG = LTG due to estimated d/c date.  Skilled Therapeutic Interventions/Progress Updates:    Patient in supine and reports plans now for home tomorrow if SW able to work out with his wife.  Agreeable to participate in d/c assessment.  Performed supine to sit independent and sit to stand mod I.  Patient ambulated with RW over level tile and over carpeted surfaces with S x 150'.  Performed car transfer to simulated [employer] height 23" with S and cues for hand placement.  Sitting balance demonstrated mod I dynamic reaching and standing balance with S with stepping.  Patient negotiated 8 3" steps with rails and S and 4 6" steps with L rail (sideways) and CGA.  Patient performed furniture transfer to couch in ADL apartment with CGA due to low surface.  Reports furniture at home all except one is higher surface.  Patient assisted in w/c back to room and transferred to bed with RW mod I.  Left with call bell in reach and bed alarm activated.    Therapy Documentation Precautions:  Precautions Precautions: Fall Restrictions Weight Bearing Restrictions: No Other Position/Activity Restrictions: PEG tube Pain: Pain Assessment Pain Score: 3  Pain Type: Acute pain Pain Location: Abdomen(where PEG was removed this morning) Pain Descriptors / Indicators: Discomfort Pain Onset: With Activity Pain Intervention(s): Repositioned    Therapy/Group: Individual Therapy  Elray Mcgregor  [place], PT 08/25/2018, 1:18 PM

## 2018-08-25 NOTE — Progress Notes (Signed)
[place] PHYSICAL MEDICINE & REHABILITATION PROGRESS NOTE   Subjective/Complaints:  no new problems. Ready for PEG out  ROS: Patient denies fever, rash, sore throat, blurred vision, nausea, vomiting, diarrhea, cough, shortness of breath or chest pain, joint or back pain, headache, or mood change.    .   Objective:   No results found. No results for input(s): WBC, HGB, HCT, PLT in the last 72 hours. No results for input(s): NA, K, CL, CO2, GLUCOSE, BUN, CREATININE, CALCIUM in the last 72 hours.  Intake/Output Summary (Last 24 hours) at 08/25/2018 0839 Last data filed at 08/25/2018 0551 Gross per 24 hour  Intake 540 ml  Output 300 ml  Net 240 ml     Physical Exam: Vital Signs Blood pressure 97/63, pulse 89, temperature 98.6 F (37 C), temperature source Oral, resp. rate 20, height 6\' 3"  (1.905 m), weight 86.4 kg, SpO2 96 %.  Constitutional: No distress . Vital signs reviewed. HEENT: EOMI, oral membranes moist Neck: supple Cardiovascular: RRR without murmur. No JVD    Respiratory: CTA Bilaterally without wheezes or rales. Normal effort    GI: BS +, non-tender, non-distended  Musculoskeletal:  General: No edema.  Neurological: He isalertand oriented to person, place, and time. Reasonable insight.   Normal language, follows all commands. UE 4-5/5 prox to distal except for 3/5 strength in HI right more preserved than left. LE: 3+-4/5 .   Skin: Skin iswarmand dry.  Psychiatric: flat  Assessment/Plan: 1. Functional deficits secondary to debility after multiple medical which require 3+ hours per day of interdisciplinary therapy in a comprehensive inpatient rehab setting.  Physiatrist is providing close team supervision and 24 hour management of active medical problems listed below.  Physiatrist and rehab team continue to assess barriers to discharge/monitor patient progress toward functional and medical goals  Care Tool:  Bathing  Bathing activity did not  occur: Safety/medical concerns Body parts bathed by patient: Right arm, Right upper leg, Left arm, Left upper leg, Abdomen, Right lower leg, Chest, Front perineal area, Left lower leg, Face, Buttocks         Bathing assist Assist Level: Supervision/Verbal cueing     Upper Body Dressing/Undressing Upper body dressing   What is the patient wearing?: Pull over shirt    Upper body assist Assist Level: Supervision/Verbal cueing    Lower Body Dressing/Undressing Lower body dressing      What is the patient wearing?: Underwear/pull up, Pants     Lower body assist Assist for lower body dressing: Supervision/Verbal cueing     Toileting Toileting    Toileting assist Assist for toileting: Contact Guard/Touching assist Assistive Device Comment: (bed pan)   Transfers Chair/bed transfer  Transfers assist     Chair/bed transfer assist level: Supervision/Verbal cueing     Locomotion Ambulation   Ambulation assist      Assist level: Contact Guard/Touching assist Assistive device: Walker-rolling Max distance: 125 ft   Walk 10 feet activity   Assist     Assist level: Contact Guard/Touching assist Assistive device: Walker-rolling   Walk 50 feet activity   Assist    Assist level: Contact Guard/Touching assist Assistive device: Walker-rolling    Walk 150 feet activity   Assist Walk 150 feet activity did not occur: Safety/medical concerns  Assist level: Supervision/Verbal cueing Assistive device: Walker-rolling    Walk 10 feet on uneven surface  activity   Assist Walk 10 feet on uneven surfaces activity did not occur: Safety/medical concerns  Wheelchair     Assist Will patient use wheelchair at discharge?: (TBD) Type of Wheelchair: Manual    Wheelchair assist level: Supervision/Verbal cueing Max wheelchair distance: 150 ft    Wheelchair 50 feet with 2 turns activity    Assist        Assist Level: Supervision/Verbal cueing    Wheelchair 150 feet activity     Assist Wheelchair 150 feet activity did not occur: Safety/medical concerns   Assist Level: Supervision/Verbal cueing    Medical Problem List and Plan: 1.Functional and mobility deficitssecondary to respiratory failure and multiple medical issues --Continue CIR therapies including PT, OT, and SLP    2. Antithrombotics: -DVT/anticoagulation: lovenox 40mg  q12             -dopplers negative 4/15 -antiplatelet therapy: asa 325mg  3. Pain Management: tylenol and hydrocodone 4. Mood:  -antipsychotic agents: n/a             -trazodone 25-50mg  qhs for sleep 5. Neuropsych: This patientiscapable of making decisions on hisown behalf. 6. Skin/Wound Care:  stoma still open  -continue dressing  -daily dressing 7. Fluids/Electrolytes/Nutrition:              -D3 and thins diet currently  -eating very well             -removed PEG today with traction today. No issues. Pressure dressing applied. May eat at lunch. Water until then   8. Type 2 DM:  good control at present  -off insulin       9. Pulmonary: decannulated, s/p thoracentesis---negative for malignancy             -oxygen therapy via [place] to keep sats >90%             -pt has not tolerated CPAP---revisit as outpt             -robitussin dm prn for cough  -weaning  oxygen  off  10. Atrial fibrillation: amiodarone   - rate controlled 4/22 11. Anemia of chronic disease: hgb 10.6 4/20    LOS: 10 days A FACE TO FACE EVALUATION WAS PERFORMED  Ranelle Oyster 08/25/2018, 8:39 AM

## 2018-08-25 NOTE — Progress Notes (Signed)
  Patient ID: Luis Choi, male   DOB: 04/05/1966, 53 y.o.   MRN: [id number]      Diagnosis codes:   R53.81;  J96.21;  G71.11   Height: 6'3"         Weight:    190 lbs       Patient suffers from severe debility following respiratory failure and chronic afib  which impairs their ability to perform daily activities like toileting and walking in the home.  A rolling walker  will not resolve issue with performing activities of daily living.  A wheelchair will allow patient to safely perform daily activities.  Patient is not able to propel themselves in the home using a standard weight wheelchair due to significant weakness .  Patient can self propel in the lightweight wheelchair.

## 2018-08-25 NOTE — Progress Notes (Signed)
Slept well throughout the night. No complaints noted. CHG bath given this am. Blood glucose 108 this am. Vital signs stable.

## 2018-08-25 NOTE — Progress Notes (Signed)
Occupational Therapy Discharge Summary  Patient Details  Name: Luis Choi MRN: [id number] Date of Birth: Jan 10, 1966  Patient has met 69 of 11 long term goals due to improved activity tolerance, improved balance, postural control, ability to compensate for deficits, functional use of  RIGHT upper, RIGHT lower, LEFT upper and LEFT lower extremity, improved attention, improved awareness and improved coordination.  Patient to discharge at overall Supervision level.  Pt's wife participated in family training via telephone and video conferencing to demonstrate safe supervision practices, body mechanics, transfers and cueing. Patient's care partner is independent to provide the necessary physical and cognitive assistance at discharge.   Reasons goals not met: n/a  Recommendation:  Patient will benefit from ongoing skilled OT services in home health setting to continue to advance functional skills in the area of BADL and iADL.  Equipment: TTB  Reasons for discharge: treatment goals met  Patient/family agrees with progress made and goals achieved: Yes  OT Discharge Precautions/Restrictions  Precautions Precautions: Fall Restrictions Weight Bearing Restrictions: No General   Vital Signs Therapy Vitals Temp: 97.9 F (36.6 C) Temp Source: Oral Pulse Rate: (!) 103 BP: 98/68 Oxygen Therapy SpO2: 96 % Pain Pain Assessment Pain Score: 0-No pain Pain Type: Acute pain Pain Location: Abdomen(where PEG was removed this morning) Pain Descriptors / Indicators: Discomfort Pain Onset: With Activity Pain Intervention(s): Repositioned ADL ADL Eating: Modified independent Grooming: Setup Upper Body Bathing: Supervision/safety Where Assessed-Upper Body Bathing: Shower Lower Body Bathing: Supervision/safety Where Assessed-Lower Body Bathing: Shower Upper Body Dressing: Supervision/safety Where Assessed-Upper Body Dressing: Sitting at sink Lower Body Dressing: Supervision/safety Where  Assessed-Lower Body Dressing: Standing at sink Toileting: Supervision/safety Toilet Transfer: Close supervision Toilet Transfer Method: Arts development officer: Radiographer, therapeutic: Close supervison Vision Baseline Vision/History: Wears glasses Wears Glasses: Reading only Patient Visual Report: No change from baseline Vision Assessment?: No apparent visual deficits Perception  Perception: Within Functional Limits Praxis Praxis: Intact Cognition Overall Cognitive Status: Within Functional Limits for tasks assessed Arousal/Alertness: Awake/alert Orientation Level: Oriented X4 Attention: Selective Focused Attention: Appears intact Sustained Attention: Appears intact Selective Attention: Appears intact Memory: Appears intact Memory Impairment: Decreased short term memory Awareness: Appears intact Problem Solving: Appears intact Safety/Judgment: Appears intact Sensation Sensation Light Touch: Appears Intact Hot/Cold: Appears Intact Coordination Gross Motor Movements are Fluid and Coordinated: Yes Fine Motor Movements are Fluid and Coordinated: No Coordination and Movement Description: impaired motor coordination secondary to muscular weakness and muscular dystrophy Finger Nose Finger Test: [facility] Motor  Motor Motor: Other (comment) Motor - Skilled Clinical Observations: B LE (L>R) weakness and generalized weakness Motor - Discharge Observations: generalized weakness, Bil LE weakness distal worse than proximal Mobility  Bed Mobility Bed Mobility: Rolling Right;Supine to Sit;Sit to Supine Rolling Right: Independent Supine to Sit: Independent with assistive device Sit to Supine: Independent with assistive device Transfers Sit to Stand: Independent with assistive device Stand to Sit: Independent with assistive device  Trunk/Postural Assessment  Cervical Assessment Cervical Assessment: Exceptions to WFL(head forward) Thoracic Assessment Thoracic  Assessment: Exceptions to WFL(rounded shoulders) Lumbar Assessment Lumbar Assessment: Exceptions to WFL(post pelvic tilt) Postural Control Postural Control: Deficits on evaluation Righting Reactions: delayed Protective Responses: impaired Postural Limitations: chronic postural changes due to muscular dystrophy  Balance Static Sitting Balance Static Sitting - Level of Assistance: 7: Independent Dynamic Sitting Balance Dynamic Sitting - Level of Assistance: 6: Modified independent (Device/Increase time) Dynamic Sitting - Balance Activities: Lateral lean/weight shifting Sitting balance - Comments: pt with posterior/lateral leans Static Standing Balance Static  Standing - Balance Support: No upper extremity supported Static Standing - Level of Assistance: 5: Stand by assistance Dynamic Standing Balance Dynamic Standing - Balance Support: Bilateral upper extremity supported;No upper extremity supported Dynamic Standing - Level of Assistance: 5: Stand by assistance Dynamic Standing - Balance Activities: Other (comment) Dynamic Standing - Comments: stepping forward and back versus ambulation Extremity/Trunk Assessment RUE Assessment RUE Assessment: Exceptions to [facility] General Strength Comments: generalized weakness improved from eval LUE Assessment LUE Assessment: Exceptions to [facility] General Strength Comments: generalized weakness improved from eval   Tonny Branch 08/25/2018, 3:40 PM

## 2018-08-25 NOTE — Progress Notes (Signed)
Occupational Therapy Session Note  Patient Details  Name: Luis Choi MRN: [id number] Date of Birth: April 20, 1966  Today's Date: 08/25/2018 OT Individual Time: 7902-4097 OT Individual Time Calculation (min): 75 min    Short Term Goals: Week 1:  OT Short Term Goal 1 (Week 1): Pt will maintain SpO2 > 94% on RA during 1 standing level grooming task OT Short Term Goal 1 - Progress (Week 1): Met OT Short Term Goal 2 (Week 1): Pt will don pants using LRAD with CGA OT Short Term Goal 2 - Progress (Week 1): Met OT Short Term Goal 3 (Week 1): Pt will don B socks with LRAD with CGA OT Short Term Goal 3 - Progress (Week 1): Met OT Short Term Goal 4 (Week 1): Pt will transfer to comfort height toilet with CGA  OT Short Term Goal 4 - Progress (Week 1): Met  Skilled Therapeutic Interventions/Progress Updates:    1:1. Pt received in bed with no pain reporting PEG removed. Pt reports not wanting to shower but needing to toilet. Pt completes ambulation with RW with supervision into bathroom to transfer onto toilet. Pt completes components of toileting with S overall. Pt's wife to call during seconds session for family ed. Pt completes grooming at sink seated for energy conservation. Pt completes shower transfer 2x after discussion of shower set up using posterior method. Tray table placed where pt would have grab bar in shower for improved power up into standing from shower seat. Pt plays novel game of quirkle in standing with VC for rule following with 2 seated rest breaks. Exited session with pt seated in bed, exit alarm on and call lightin reach  Therapy Documentation Precautions:  Precautions Precautions: Fall Restrictions Weight Bearing Restrictions: No Other Position/Activity Restrictions: PEG tube General:   Vital Signs: Therapy Vitals Temp: 98.6 F (37 C) Temp Source: Oral Pulse Rate: 89 Resp: 20 BP: 97/63 Patient Position (if appropriate): Lying Oxygen Therapy SpO2: 96 % O2  Device: Room Air Pain:   ADL:   Vision   Perception    Praxis   Exercises:   Other Treatments:     Therapy/Group: Individual Therapy  Tonny Branch 08/25/2018, 8:29 AM

## 2018-08-25 NOTE — Progress Notes (Addendum)
Physical Therapy Discharge Summary  Patient Details  Name: Luis Choi MRN: [id number] Date of Birth: Sep 10, 1965  Today's Date: 08/25/2018    Patient has met 10 of 10 long term goals due to improved activity tolerance, improved balance and increased strength.  Patient to discharge at an ambulatory level Supervision with RW.      Primary therapist contacted pt's wife via telephone & educated her on pt's CLOF of supervision with RW (pt to use RW at all times unless instructed otherwise by HHPT), and min assist for stair negotiation with pt's preference of holding to door frame with wife placing RW in/out of house for pt. Pt's wife voices no questions/concerns at this time. Pt is cognitively able to recall information & instruct wife in assisting him with tasks.   Reasons goals not met: Goals met with pt achieving supervision level for mobility and modified independent for transfers.   Recommendation:  Patient will benefit from ongoing skilled PT services in home health setting to continue to advance safe functional mobility, address ongoing impairments in LE strength, balance, endurance, and minimize fall risk.  Equipment: RW, 18x18 w/c with ELR's  Reasons for discharge: treatment goals met and discharge from hospital  Patient/family agrees with progress made and goals achieved: Yes  PT Discharge Precautions/Restrictions Precautions Precautions: Fall Restrictions Weight Bearing Restrictions: No Pain Pain Assessment Pain Score: 0-No pain Pain Type: Acute pain Pain Location: Abdomen(where PEG was removed this morning) Pain Descriptors / Indicators: Discomfort Pain Onset: With Activity Pain Intervention(s): Repositioned Vision/Perception  Perception Perception: Within Functional Limits Praxis Praxis: Intact  Cognition Overall Cognitive Status: Within Functional Limits for tasks assessed Arousal/Alertness: Awake/alert Orientation Level: Oriented X4 Attention:  Selective Focused Attention: Appears intact Sustained Attention: Appears intact Selective Attention: Appears intact Memory: Appears intact Memory Impairment: Decreased short term memory Awareness: Appears intact Problem Solving: Appears intact Safety/Judgment: Appears intact Sensation Sensation Light Touch: Appears Intact Hot/Cold: Appears Intact Coordination Gross Motor Movements are Fluid and Coordinated: Yes Fine Motor Movements are Fluid and Coordinated: No Coordination and Movement Description: impaired motor coordination secondary to muscular weakness and muscular dystrophy Finger Nose Finger Test: [facility] Motor  Motor Motor: Other (comment) Motor - Skilled Clinical Observations: B LE (L>R) weakness and generalized weakness Motor - Discharge Observations: generalized weakness, Bil LE weakness distal worse than proximal  Mobility Bed Mobility Bed Mobility: Rolling Right;Supine to Sit;Sit to Supine Rolling Right: Independent Supine to Sit: Independent with assistive device Sit to Supine: Independent with assistive device Transfers Transfers: Sit to Stand Sit to Stand: Independent with assistive device Stand to Sit: Independent with assistive device Stand Pivot Transfers: Independent with assistive device Stand Pivot Transfer Details: Verbal cues for sequencing;Verbal cues for technique;Visual cues/gestures for sequencing Stand Pivot Transfer Details (indicate cue type and reason): with RW  Transfer (Assistive device): Rolling walker Locomotion  Gait Ambulation: Yes Gait Assistance: Supervision/Verbal cueing Gait Distance (Feet): 150 Feet Assistive device: Rolling walker Gait Assistance Details: Verbal cues for precautions/safety Gait Assistance Details: posture and decrease speed Gait Gait Pattern: Right flexed knee in stance;Left flexed knee in stance;Decreased stride length;Trunk flexed Stairs / Additional Locomotion Ramp: Contact Guard/touching assist   Trunk/Postural Assessment  Cervical Assessment Cervical Assessment: Exceptions to WFL(head forward) Thoracic Assessment Thoracic Assessment: Exceptions to WFL(rounded shoulders) Lumbar Assessment Lumbar Assessment: Exceptions to WFL(post pelvic tilt) Postural Control Postural Control: Deficits on evaluation Righting Reactions: delayed Protective Responses: impaired Postural Limitations: chronic postural changes due to muscular dystrophy  Balance Static Sitting Balance Static Sitting - Level  of Assistance: 7: Independent Dynamic Sitting Balance Dynamic Sitting - Level of Assistance: 6: Modified independent (Device/Increase time) Dynamic Sitting - Balance Activities: Lateral lean/weight shifting Sitting balance - Comments: pt with posterior/lateral leans Static Standing Balance Static Standing - Balance Support: No upper extremity supported Static Standing - Level of Assistance: 5: Stand by assistance Dynamic Standing Balance Dynamic Standing - Balance Support: Bilateral upper extremity supported;No upper extremity supported Dynamic Standing - Level of Assistance: 5: Stand by assistance Dynamic Standing - Balance Activities: Other (comment) Dynamic Standing - Comments: stepping forward and back versus ambulation Extremity Assessment  RUE Assessment RUE Assessment: Exceptions to [facility] General Strength Comments: generalized weakness improved from eval LUE Assessment LUE Assessment: Exceptions to [facility] General Strength Comments: generalized weakness improved from eval RLE Assessment RLE Assessment: Exceptions to [facility] Active Range of Motion (AROM) Comments: WFL General Strength Comments: general weakness in LE's especially ankle musculature (unable to heel raise in standing) and knee extensors (unable to stand without UE support) this is his baseline per pt LLE Assessment LLE Assessment: Exceptions to [facility] Active Range of Motion (AROM) Comments: WFL General Strength Comments: general  weakness in LE's especially ankle musculature (unable to heel raise in standing) and knee extensors (unable to stand without UE support) this is his baseline per pt   Lavone Nian, PT, DPT 08/26/2018, 12:41 PM  Reginia Naas 08/25/2018, 5:12 PM

## 2018-08-25 NOTE — Progress Notes (Signed)
Occupational Therapy Session Note  Patient Details  Name: Luis Choi MRN: [id number] Date of Birth: 1966/04/30  Today's Date: 08/25/2018 OT Individual Time: 1415-1530 OT Individual Time Calculation (min): 75 min    Short Term Goals: Week 1:  OT Short Term Goal 1 (Week 1): Pt will maintain SpO2 > 94% on RA during 1 standing level grooming task OT Short Term Goal 1 - Progress (Week 1): Met OT Short Term Goal 2 (Week 1): Pt will don pants using LRAD with CGA OT Short Term Goal 2 - Progress (Week 1): Met OT Short Term Goal 3 (Week 1): Pt will don B socks with LRAD with CGA OT Short Term Goal 3 - Progress (Week 1): Met OT Short Term Goal 4 (Week 1): Pt will transfer to comfort height toilet with CGA  OT Short Term Goal 4 - Progress (Week 1): Met  Skilled Therapeutic Interventions/Progress Updates:    1:1. Pt received in bed with vitals WNL. Pt completes ambulatory transfer to w/c from EOB with S. Pt calls wife for family education on supervision of transfers, biomechanics of sit to stand, not rushing through transfers, eccentric control when sitting and shower transfers. Pt able to video call with wife for OT to demonstrate shower transfer using posterior method. Wife verbalizes understanding and does not have any more questions. Pt requesting to clean room and OT educates on dynamic reaching in w/c and brake management. Pt able toreturn demo understanding of education when mobilizing around room. Pt completes 5x3 sit to stand and remains standing for 45 seconds after 3rd stand with 1 min rest in between rounds for general strengthening and endurance. Exited session with pt seated in bed, exit alarm on and call light in reach  Therapy Documentation Precautions:  Precautions Precautions: Fall Restrictions Weight Bearing Restrictions: No Other Position/Activity Restrictions: PEG tube General:   Vital Signs: Therapy Vitals Temp: 97.9 F (36.6 C) Temp Source: Oral Pulse Rate: (!)  103 BP: 98/68 Oxygen Therapy SpO2: 96 % Pain: Pain Assessment Pain Score: 3  Pain Type: Acute pain Pain Location: Abdomen(where PEG was removed this morning) Pain Descriptors / Indicators: Discomfort Pain Onset: With Activity Pain Intervention(s): Repositioned ADL:   Vision Baseline Vision/History: Wears glasses Wears Glasses: Reading only Patient Visual Report: No change from baseline Vision Assessment?: No apparent visual deficits Perception  Perception: Within Functional Limits Praxis Praxis: Intact Exercises:   Other Treatments:     Therapy/Group: Individual Therapy  Tonny Branch 08/25/2018, 2:42 PM

## 2018-08-26 ENCOUNTER — Inpatient Hospital Stay (HOSPITAL_COMMUNITY): Payer: BLUE CROSS/BLUE SHIELD | Admitting: Physical Therapy

## 2018-08-26 ENCOUNTER — Inpatient Hospital Stay (HOSPITAL_COMMUNITY): Payer: BLUE CROSS/BLUE SHIELD

## 2018-08-26 DIAGNOSIS — R9389 Abnormal findings on diagnostic imaging of other specified body structures: Secondary | ICD-10-CM

## 2018-08-26 LAB — GLUCOSE, CAPILLARY: Glucose-Capillary: 109 mg/dL — ABNORMAL HIGH (ref 70–99)

## 2018-08-26 MED ORDER — PANTOPRAZOLE SODIUM 40 MG PO TBEC: 40.0000 mg | DELAYED_RELEASE_TABLET | Freq: Every day | ORAL | 1 refills | Status: AC

## 2018-08-26 MED ORDER — AMIODARONE HCL 200 MG PO TABS: 200.0000 mg | ORAL_TABLET | Freq: Every day | ORAL | 1 refills | Status: AC

## 2018-08-26 MED ORDER — ACETAMINOPHEN 325 MG PO TABS: 325.0000 mg | ORAL_TABLET | ORAL | Status: AC | PRN

## 2018-08-26 MED ORDER — LEVOTHYROXINE SODIUM 112 MCG PO TABS: 112.0000 ug | ORAL_TABLET | Freq: Every day | ORAL | 1 refills | Status: AC

## 2018-08-26 NOTE — Progress Notes (Signed)
[facility] PHYSICAL MEDICINE & REHABILITATION PROGRESS NOTE   Subjective/Complaints: No new issues. Stomach minimally tender  ROS: Patient denies fever, rash, sore throat, blurred vision, nausea, vomiting, diarrhea, cough, shortness of breath or chest pain, joint or back pain, headache, or mood change.   Objective:   No results found. No results for input(s): WBC, HGB, HCT, PLT in the last 72 hours. No results for input(s): NA, K, CL, CO2, GLUCOSE, BUN, CREATININE, CALCIUM in the last 72 hours.  Intake/Output Summary (Last 24 hours) at 08/26/2018 0918 Last data filed at 08/26/2018 0820 Gross per 24 hour  Intake 942 ml  Output 800 ml  Net 142 ml     Physical Exam: Vital Signs Blood pressure 102/70, pulse 85, temperature 97.8 F (36.6 C), temperature source Oral, resp. rate 16, height 6\' 3"  (1.905 m), weight 86.4 kg, SpO2 93 %.  Constitutional: No distress . Vital signs reviewed. HEENT: EOMI, oral membranes moist Neck: supple, trach site still open. Stoma appears to be closing Cardiovascular: RRR without murmur. No JVD    Respiratory: CTA Bilaterally without wheezes or rales. Normal effort    GI: BS +, non-tender, non-distended  Musculoskeletal:  General: No edema.  Neurological: He isalertand oriented to person, place, and time. Reasonable insight.   Normal language, follows all commands. UE 4-5/5 prox to distal except for 3/5 strength in HI right more preserved than left. LE: 3+-4/5 .   Skin: Skin iswarmand dry.  Psychiatric: pleasant  Assessment/Plan: 1. Functional deficits secondary to debility after multiple medical which require 3+ hours per day of interdisciplinary therapy in a comprehensive inpatient rehab setting.  Physiatrist is providing close team supervision and 24 hour management of active medical problems listed below.  Physiatrist and rehab team continue to assess barriers to discharge/monitor patient progress toward functional and medical  goals  Care Tool:  Bathing  Bathing activity did not occur: Safety/medical concerns Body parts bathed by patient: Right arm, Right upper leg, Left arm, Left upper leg, Abdomen, Right lower leg, Chest, Front perineal area, Left lower leg, Face, Buttocks         Bathing assist Assist Level: Supervision/Verbal cueing     Upper Body Dressing/Undressing Upper body dressing   What is the patient wearing?: Pull over shirt    Upper body assist Assist Level: Supervision/Verbal cueing    Lower Body Dressing/Undressing Lower body dressing      What is the patient wearing?: Underwear/pull up, Pants     Lower body assist Assist for lower body dressing: Supervision/Verbal cueing     Toileting Toileting    Toileting assist Assist for toileting: Supervision/Verbal cueing Assistive Device Comment: (bed pan)   Transfers Chair/bed transfer  Transfers assist     Chair/bed transfer assist level: Supervision/Verbal cueing     Locomotion Ambulation   Ambulation assist      Assist level: Contact Guard/Touching assist Assistive device: Walker-rolling Max distance: 125 ft   Walk 10 feet activity   Assist     Assist level: Contact Guard/Touching assist Assistive device: Walker-rolling   Walk 50 feet activity   Assist    Assist level: Contact Guard/Touching assist Assistive device: Walker-rolling    Walk 150 feet activity   Assist Walk 150 feet activity did not occur: Safety/medical concerns  Assist level: Supervision/Verbal cueing Assistive device: Walker-rolling    Walk 10 feet on uneven surface  activity   Assist Walk 10 feet on uneven surfaces activity did not occur: Safety/medical concerns  Wheelchair     Assist Will patient use wheelchair at discharge?: (TBD) Type of Wheelchair: Manual    Wheelchair assist level: Supervision/Verbal cueing Max wheelchair distance: 150 ft    Wheelchair 50 feet with 2 turns  activity    Assist        Assist Level: Supervision/Verbal cueing   Wheelchair 150 feet activity     Assist Wheelchair 150 feet activity did not occur: Safety/medical concerns   Assist Level: Supervision/Verbal cueing    Medical Problem List and Plan: 1.Functional and mobility deficitssecondary to respiratory failure and multiple medical issues -dc home today  -Patient to see Rehab MD/provider in the office for transitional care encounter in 2-4 weeks.     2. Antithrombotics: -DVT/anticoagulation: lovenox 40mg  q12             -dopplers negative 4/15 -antiplatelet therapy: asa 325mg  3. Pain Management: tylenol and hydrocodone 4. Mood:  -antipsychotic agents: n/a             -trazodone 25-50mg  qhs for sleep 5. Neuropsych: This patientiscapable of making decisions on hisown behalf. 6. Skin/Wound Care:  stoma still open  -continue dressing, HH RN follow up  -daily dressing 7. Fluids/Electrolytes/Nutrition:              -D3 and thins diet currently  -eating very well             -PEG site already closed, dry---apply daily dressing as needed  8. Type 2 DM:  good control at present  -off insulin       9. Pulmonary: decannulated, s/p thoracentesis---negative for malignancy            -needs to revisit CPAP as outpt 10. Atrial fibrillation: amiodarone   - rate controlled 4/22 11. Anemia of chronic disease: hgb 10.6 4/20    LOS: 11 days A FACE TO FACE EVALUATION WAS PERFORMED  Luis Choi 08/26/2018, 9:18 AM

## 2018-08-26 NOTE — Discharge Summary (Signed)
Physician Discharge Summary  Patient ID: Reche DixonGregory F Corsetti MRN: [id number] DOB/AGE: 1965/12/09 53 y.o.  Admit date: 08/15/2018 Discharge date: 08/26/2018  Discharge Diagnoses:  Principal Problem:   Debility Active Problems:   Chronic atrial fibrillation   Pleural effusion, left   Muscular dystrophy (HCC)   Abnormal CT scan, chest   Diabetes mellitus (HCC)-diet controlled   Acute blood loss anemia   Discharged Condition: stable   Significant Diagnostic Studies: Vas Koreas Lower Extremity Venous (dvt)  Result Date: 08/17/2018  Lower Venous Study Indications: Edema.  Performing Technologist: Jeb LeveringJill Parker RDMS, RVT  Examination Guidelines: A complete evaluation includes B-mode imaging, spectral Doppler, color Doppler, and power Doppler as needed of all accessible portions of each vessel. Bilateral testing is considered an integral part of a complete examination. Limited examinations for reoccurring indications may be performed as noted.  +---------+---------------+---------+-----------+----------+-------+ RIGHT    CompressibilityPhasicitySpontaneityPropertiesSummary +---------+---------------+---------+-----------+----------+-------+ CFV      Full           Yes      Yes                          +---------+---------------+---------+-----------+----------+-------+ SFJ      Full                                                 +---------+---------------+---------+-----------+----------+-------+ FV Prox  Full                                                 +---------+---------------+---------+-----------+----------+-------+ FV Mid   Full                                                 +---------+---------------+---------+-----------+----------+-------+ FV DistalFull                                                 +---------+---------------+---------+-----------+----------+-------+ PFV      Full                                                  +---------+---------------+---------+-----------+----------+-------+ POP      Full           Yes      Yes                          +---------+---------------+---------+-----------+----------+-------+ PTV      Full                                                 +---------+---------------+---------+-----------+----------+-------+ PERO     Full                                                 +---------+---------------+---------+-----------+----------+-------+   +---------+---------------+---------+-----------+----------+-------+  LEFT     CompressibilityPhasicitySpontaneityPropertiesSummary +---------+---------------+---------+-----------+----------+-------+ CFV      Full           Yes      Yes                          +---------+---------------+---------+-----------+----------+-------+ SFJ      Full                                                 +---------+---------------+---------+-----------+----------+-------+ FV Prox  Full                                                 +---------+---------------+---------+-----------+----------+-------+ FV Mid   Full                                                 +---------+---------------+---------+-----------+----------+-------+ FV DistalFull                                                 +---------+---------------+---------+-----------+----------+-------+ PFV      Full                                                 +---------+---------------+---------+-----------+----------+-------+ POP      Full           Yes      Yes                          +---------+---------------+---------+-----------+----------+-------+ PTV      Full                                                 +---------+---------------+---------+-----------+----------+-------+ PERO     Full                                                 +---------+---------------+---------+-----------+----------+-------+     Summary:  Right: There is no evidence of deep vein thrombosis in the lower extremity. No cystic structure found in the popliteal fossa. Left: There is no evidence of deep vein thrombosis in the lower extremity. No cystic structure found in the popliteal fossa.  *See table(s) above for measurements and observations. Electronically signed by Coral Else MD on 08/17/2018 at 5:06:57 PM.    Final     Labs:  Basic Metabolic Panel: BMP Latest Ref Rng & Units 08/22/2018 08/18/2018 08/16/2018  Glucose 70 - 99 mg/dL 604(V) 409(W) 119(J)  BUN 6 - 20 mg/dL 14 19 47(W)  Creatinine 0.61 - 1.24  mg/dL 4.09 8.11 9.14  Sodium 135 - 145 mmol/L 142 142 142  Potassium 3.5 - 5.1 mmol/L 4.1 3.9 3.9  Chloride 98 - 111 mmol/L 104 102 104  CO2 22 - 32 mmol/L Calcium 8.9 - 10.3 mg/dL 9.0 9.6 9.1    CBC: CBC Latest Ref Rng & Units 08/22/2018 08/16/2018 07/31/2018  WBC 4.0 - 10.5 K/uL 9.6 9.9 8.7  Hemoglobin 13.0 - 17.0 g/dL 10.6(L) 10.3(L) 10.7(L)  Hematocrit 39.0 - 52.0 % 34.8(L) 34.7(L) 35.4(L)  Platelets 150 - 400 K/uL 394 341 327    CBG: Recent Labs  Lab 08/25/18 0628 08/25/18 1156 08/25/18 1730 08/25/18 2127 08/26/18 0621  GLUCAP 108* 104* 107* 103* 109*    Brief HPI:   Luis Choi is a 53 year old male with history of myotonic MD, PAF, hypothyroidism who was admitted to OSH with A. fib with RVR, 2 to 3 months of DOE with orthopnea and required intubation.  He was cardioverted and started on amiodarone for rate control as well as sotalol for an episode of SVT.  He failed attempts at extubation therefore required tracheostomy on 05/29/2018.  He was found to have multidrug-resistant Pseudomonas as well as suspicious mass in the chest treated with Zerbaxa.  Hospital course significant for his eosinophilia as possible side effect of Zyprexa, left chest hemothorax due to ASA, anemia and had PEG placed on 06/03/2018 for nutritional support.   He was transferred to [facility] on 06/24/2018 for vent weaning and repeat  sputum culture showed ESBL Pseudomonas and Klebsiella.  Antibiotic were changed to gentamicin and Cipro.  CT of chest 2/24 showed moderate to large bilateral pleural effusions with subtotal collapse of lower lobes and 3 cm x 1.3 cm x 1.5 cm RML mass worrisome for lung cancer as well as 1.2 cm mass RUL felt to be inflammatory versus neoplastic.  He underwent thoracocentesis of 1 L of amber fluid on 2/25 and pathology was negative for malignancy.  He was weaned to BiPAP and was decannulated by 08/12/2018.  He was tolerating dysphagia 3 diet and respiratory status had improved with improvement in activity tolerance.  Therapy ongoing with patient noted to be deconditioned and CIR recommended due to functional decline   Hospital Course: JAQUEL GLASSBURN was admitted to rehab 08/15/2018 for inpatient therapies to consist of PT, ST and OT at least three hours five days a week. Past admission physiatrist, therapy team and rehab RN have worked together to provide customized collaborative inpatient rehab. His respiratory status and endurance have gradually improved and he has been weaned off oxygen. BLE dopplers were negative for DVT.  He was maintained on lovenox for DVT prophylaxis.  His po intake has improved and he was advanced to regular textures.   T2DM has been monitored with ac/hs cbg checks and BS are well controlled on diet alone. BP/HR have been monitored on bid basis and have been controlled. He continues on amiodarone at this time and is to follow up with cardiology after discharge. Trach stoma continues to have air leak and patient has been educated on occluding site whenever speaking/coughing to help in order to help with closure. His PEG removed without difficulty on 4/23. He has made good progress and is currently at supervision level. He will continue to receive follow up HHPT and HHRN by [facility] after discharge.    Rehab course: During patient's stay in rehab weekly team conferences were  held to monitor patient's progress, set goals  and discuss barriers to discharge. At admission, patient required mod assist with mobility and min assist with basic self care task. He demonstrated mild higher level cognitive tasks with decreased speech intelligibility and wet voice. He has had improvement in activity tolerance, balance, postural control as well as ability to compensate for deficits.  He is able to perform transfers at modified independent level and needs supervision to ambulate 150' with RW and cues for safety. Family education completed regarding energy conservation measures, safety and mobility.     Disposition: Home  Diet: Diabetic/Low fat/Low cholesterol.   Special Instructions: 1. Follow up with cardiology for A fib. 2. Will need follow up CT chest to monitor for resolution/work up of RUL  Mass. 3. Change dressing on trach stoma daily and apply occlusive dressing. Needs to apply pressure to site whenever speaking, coughing or sneezing to help promote closure.   Discharge Instructions    Ambulatory referral to Physical Medicine Rehab   Complete by:  As directed      Allergies as of 08/26/2018      Reactions   Fluticasone Other (See Comments)   Nose bleed      Medication List    STOP taking these medications   enoxaparin 40 MG/0.4ML injection Commonly known as:  LOVENOX   feeding supplement (PRO-STAT SUGAR FREE 64) Liqd   HYDROcodone-acetaminophen 5-325 MG tablet Commonly known as:  NORCO/VICODIN   insulin glargine 100 UNIT/ML injection Commonly known as:  LANTUS   Melatonin 3 MG Tabs     TAKE these medications   acetaminophen 325 MG tablet Commonly known as:  TYLENOL Take 1-2 tablets (325-650 mg total) by mouth every 4 (four) hours as needed for mild pain. What changed:    how much to take  when to take this  reasons to take this   amiodarone 200 MG tablet Commonly known as:  PACERONE Take 1 tablet (200 mg total) by mouth daily.   aspirin 325  MG EC tablet Take 325 mg by mouth daily.   guaiFENesin 100 MG/5ML liquid Commonly known as:  ROBITUSSIN Take 200 mg by mouth 4 (four) times daily. Notes to patient:  Or can use Mucinex once a day   levothyroxine 112 MCG tablet Commonly known as:  SYNTHROID Take 1 tablet (112 mcg total) by mouth daily before breakfast.   multivitamins ther. w/minerals Tabs tablet Take 1 tablet by mouth daily.   pantoprazole 40 MG tablet Commonly known as:  PROTONIX Take 1 tablet (40 mg total) by mouth daily. Notes to patient:  For reflux   vitamin C 500 MG tablet Commonly known as:  ASCORBIC ACID Take 500 mg by mouth daily.      Follow-up Information    Dorian Heckle, MD Follow up on 09/01/2018.   Specialty:  Family Medicine Why:  @ 10:20 am (hospital follow up appt). Will need follow up with cardiology for A fib as well as repeat CT chest for resolution/monitoring right lung mass.  Contact information: [address] [place] [address] [phone]        Ranelle Oyster, MD Follow up.   Specialty:  Physical Medicine and Rehabilitation Why:  Office will call with follow up appointment Contact information: [address] [place] [address] [phone]        Pamella Pert, MD Follow up.   Specialty:  Internal Medicine Why:  call for follow up appointment Contact information: [address] [place] [address] [phone]  Signed: Jacquelynn Cree 08/29/2018, 5:31 PM

## 2018-08-26 NOTE — Progress Notes (Signed)
Social Work  Discharge Note  The overall goal for the admission was met for:   Discharge location: Yes - home with wife who can provide 24/7 supervision/ assist  Length of Stay: Yes - 11 days  Discharge activity level: Yes - supervision overall  Home/community participation: Yes  Services provided included: MD, RD, PT, OT, SLP, RN, TR, Pharmacy, Neuropsych and SW  Financial Services: Private Insurance: [employer]  Follow-up services arranged: Home Health: RN, PT, OT via [facility], DME: 18x18 lightweight w/c with ELRs, cushion, rolling walker, tub seat via [facility] and Patient/Family has no preference for HH/DME agencies  Comments (or additional information):            Contact info:  Pt's (C) [phone]; (H) [phone]                Wife, Luis Choi @ [phone]  Patient/Family verbalized understanding of follow-up arrangements: Yes  Individual responsible for coordination of the follow-up plan: pt  Confirmed correct DME delivered: Luis Choi 08/26/2018    [name]

## 2018-08-26 NOTE — Plan of Care (Signed)
  Problem: Consults Goal: RH GENERAL PATIENT EDUCATION Description See Patient Education module for education specifics. Outcome: Completed/Met   Problem: RH BOWEL ELIMINATION Goal: RH STG MANAGE BOWEL WITH ASSISTANCE Description STG Manage Bowel with mod I Assistance.  Outcome: Completed/Met   Problem: RH SAFETY Goal: RH STG ADHERE TO SAFETY PRECAUTIONS W/ASSISTANCE/DEVICE Description STG Adhere to Safety Precautions With cues/reminders Assistance/Device.  Outcome: Completed/Met   Problem: RH KNOWLEDGE DEFICIT GENERAL Goal: RH STG INCREASE KNOWLEDGE OF SELF CARE AFTER HOSPITALIZATION Description Pt will be able to direct care at discharge independently using handouts/educational resources  Outcome: Completed/Met

## 2018-08-26 NOTE — Progress Notes (Signed)
Slept well throughout the shift. No complaints voiced. Post peg removal, no adverse effects noted.

## 2018-08-26 NOTE — Discharge Instructions (Signed)
Inpatient Rehab Discharge Instructions  Luis Choi Discharge date and time: 08/26/18   Activities/Precautions/ Functional Status: Activity: no lifting, driving, or strenuous exercise till cleared by MD Diet: diabetic diet and low fat, low cholesterol diet Wound Care: keep dry occlusive dressing on neck--need to put pressure on prior trach site anytime you talk, sneeze or cough as this will help it close up. Keep prior PED site clean and dry.   Functional status:  ___ No restrictions     ___ Walk up steps independently _X__ 24/7 supervision/assistance   ___ Walk up steps with assistance ___ Intermittent supervision/assistance  ___ Bathe/dress independently ___ Walk with walker     ___ Bathe/dress with assistance ___ Walk Independently    ___ Shower independently _X__ Walk with supervision     _X__ Shower with assistance ___ No alcohol     ___ Return to work/school ________   Special Instructions:  COMMUNITY REFERRALS UPON DISCHARGE:    Home Health:   PT     OT     RN                      Agency:  [facility] Phone: [phone]   Medical Equipment/Items Ordered:  Wheelchair, cushion, rolling walker, tub seat                                                     Agency/Supplier:  [facility] @ [phone]    My questions have been answered and I understand these instructions. I will adhere to these goals and the provided educational materials after my discharge from the hospital.  Patient/Caregiver Signature _______________________________ Date __________  Clinician Signature _______________________________________ Date __________  Please bring this form and your medication list with you to all your follow-up doctor's appointments.

## 2018-08-26 NOTE — Progress Notes (Signed)
Patient/family education provided by this writer on wound care to trach site and peg site. Patient and wife both understands how to clean areas s/s of infections and how to apply dressing to area. Kalman Shan, LPN

## 2018-08-29 DIAGNOSIS — E119 Type 2 diabetes mellitus without complications: Secondary | ICD-10-CM

## 2018-08-29 DIAGNOSIS — D62 Acute posthemorrhagic anemia: Secondary | ICD-10-CM

## 2018-08-30 ENCOUNTER — Telehealth: Payer: Self-pay

## 2018-08-30 ENCOUNTER — Encounter: Payer: Self-pay | Admitting: Registered Nurse

## 2018-08-30 ENCOUNTER — Other Ambulatory Visit: Payer: Self-pay

## 2018-08-30 ENCOUNTER — Encounter: Payer: BLUE CROSS/BLUE SHIELD | Attending: Registered Nurse | Admitting: Registered Nurse

## 2018-08-30 DIAGNOSIS — J90 Pleural effusion, not elsewhere classified: Secondary | ICD-10-CM

## 2018-08-30 DIAGNOSIS — G7100 Muscular dystrophy, unspecified: Secondary | ICD-10-CM | POA: Diagnosis not present

## 2018-08-30 DIAGNOSIS — R5381 Other malaise: Secondary | ICD-10-CM

## 2018-08-30 DIAGNOSIS — I482 Chronic atrial fibrillation, unspecified: Secondary | ICD-10-CM | POA: Diagnosis not present

## 2018-08-30 NOTE — Telephone Encounter (Signed)
Corinna Gab, RN from [facility] called requesting verbal orders for [facility] 1wk5 with 2 prn visits. Orders approved and given per discharge summary.

## 2018-08-30 NOTE — Progress Notes (Signed)
Transitional Care call  Patient name: Luis Choi DOB: 07-04-1965 1. Are you/is patient experiencing any problems since coming home? No a. Are there any questions regarding any aspect of care? No 2. Are there any questions regarding medications administration/dosing? No a. Are meds being taken as prescribed? Yes,  b. "Patient should review meds with caller to confirm" Medication List Reviewed 3. Have there been any falls? No 4. Has Home Health been to the house and/or have they contacted you? Yes, [facility]  a. If not, have you tried to contact them? NA b. Can we help you contact them? NA 5. Are bowels and bladder emptying properly? Yes a. Are there any unexpected incontinence issues? No b. If applicable, is patient following bowel/bladder programs? NA 6. Any fevers, problems with breathing, unexpected pain? No 7. Are there any skin problems or new areas of breakdown? No 8. Has the patient/family member arranged specialty MD follow up (ie cardiology/neurology/renal/surgical/etc.)?  He was instructed to call Dr. Alonza Bogus office to schedule a HFU appointment, he verbalizes understanding.  a. Can we help arrange? No 9. Does the patient need any other services or support that we can help arrange? No 10. Are caregivers following through as expected in assisting the patient? Yes 11. Has the patient quit smoking, drinking alcohol, or using drugs as recommended? Mr. Butters states he doesn't smoke, drink alcohol or use illicit drugs.   Appointment date/time 09/27/2018  arrival time 10:00 for 10:20 appointment with Dr. Riley Kill. At [address] [facility] [address]

## 2018-09-07 DIAGNOSIS — Z7982 Long term (current) use of aspirin: Secondary | ICD-10-CM

## 2018-09-07 DIAGNOSIS — G7111 Myotonic muscular dystrophy: Secondary | ICD-10-CM

## 2018-09-07 DIAGNOSIS — Z431 Encounter for attention to gastrostomy: Secondary | ICD-10-CM

## 2018-09-07 DIAGNOSIS — Z8701 Personal history of pneumonia (recurrent): Secondary | ICD-10-CM

## 2018-09-07 DIAGNOSIS — Z430 Encounter for attention to tracheostomy: Secondary | ICD-10-CM

## 2018-09-07 DIAGNOSIS — E119 Type 2 diabetes mellitus without complications: Secondary | ICD-10-CM

## 2018-09-07 DIAGNOSIS — I480 Paroxysmal atrial fibrillation: Secondary | ICD-10-CM

## 2018-09-27 ENCOUNTER — Other Ambulatory Visit: Payer: Self-pay

## 2018-09-27 ENCOUNTER — Encounter: Payer: BLUE CROSS/BLUE SHIELD | Attending: Registered Nurse | Admitting: Physical Medicine & Rehabilitation

## 2018-09-27 ENCOUNTER — Encounter: Payer: Self-pay | Admitting: Physical Medicine & Rehabilitation

## 2018-09-27 VITALS — BP 103/65 | HR 92 | Temp 98.2°F | Ht 75.0 in | Wt 205.0 lb

## 2018-09-27 DIAGNOSIS — J9691 Respiratory failure, unspecified with hypoxia: Secondary | ICD-10-CM | POA: Diagnosis not present

## 2018-09-27 DIAGNOSIS — Z7409 Other reduced mobility: Secondary | ICD-10-CM | POA: Diagnosis not present

## 2018-09-27 DIAGNOSIS — Z9049 Acquired absence of other specified parts of digestive tract: Secondary | ICD-10-CM | POA: Diagnosis not present

## 2018-09-27 DIAGNOSIS — Z8052 Family history of malignant neoplasm of bladder: Secondary | ICD-10-CM | POA: Insufficient documentation

## 2018-09-27 DIAGNOSIS — G7100 Muscular dystrophy, unspecified: Secondary | ICD-10-CM | POA: Insufficient documentation

## 2018-09-27 DIAGNOSIS — J9509 Other tracheostomy complication: Secondary | ICD-10-CM | POA: Insufficient documentation

## 2018-09-27 DIAGNOSIS — R5381 Other malaise: Secondary | ICD-10-CM

## 2018-09-27 DIAGNOSIS — E119 Type 2 diabetes mellitus without complications: Secondary | ICD-10-CM | POA: Insufficient documentation

## 2018-09-27 DIAGNOSIS — I482 Chronic atrial fibrillation, unspecified: Secondary | ICD-10-CM | POA: Diagnosis not present

## 2018-09-27 NOTE — Progress Notes (Signed)
Subjective:    Patient ID: Luis Choi, male    DOB: 09/20/1965, 53 y.o.   MRN: [id number]  HPI   Luis Choi is here in follow up of his rehab stay. He is independent with bathing and dressing his self care. He is ambulating with a straight cane at home.  He had one fall 2 to 3 weeks ago but nothing since.  He still favors the right leg slightly during ambulation.  He denies pain.  Appetite is reasonable.  Bowels and bladder seem to be moving regularly.  He still is having air movement through the trach stoma.  Wife is dressing the area daily.  He denies any fever or chills or excessive secretions..    Pain Inventory Average Pain 0 Pain Right Now 0 My pain is na  In the last 24 hours, has pain interfered with the following? General activity 0 Relation with others 0 Enjoyment of life 0 What TIME of day is your pain at its worst? na Sleep (in general) Fair  Pain is worse with: na Pain improves with: na Relief from Meds: na  Mobility walk without assistance use a cane use a walker  Function disabled: date disabled 2018  Neuro/Psych bowel control problems  Prior Studies Any changes since last visit?  no  Physicians involved in your care Any changes since last visit?  no   Family History  Problem Relation Age of Onset  . Bladder Cancer Father   . Heart failure Father   . Muscular dystrophy Sister   . Muscular dystrophy Brother   . Dementia Mother    Social History   Socioeconomic History  . Marital status: Married    Spouse name: Not on file  . Number of children: Not on file  . Years of education: Not on file  . Highest education level: Not on file  Occupational History  . Not on file  Social Needs  . Financial resource strain: Not hard at all  . Food insecurity:    Worry: Never true    Inability: Never true  . Transportation needs:    Medical: No    Non-medical: No  Tobacco Use  . Smoking status: Never Smoker  . Smokeless tobacco: Never Used   Substance and Sexual Activity  . Alcohol use: Not on file  . Drug use: Not on file  . Sexual activity: Not on file  Lifestyle  . Physical activity:    Days per week: 0 days    Minutes per session: 0 min  . Stress: Only a little  Relationships  . Social connections:    Talks on phone: Not on file    Gets together: Once a week    Attends religious service: Not on file    Active member of club or organization: Not on file    Attends meetings of clubs or organizations: Not on file    Relationship status: Not on file  Other Topics Concern  . Not on file  Social History Narrative  . Not on file   Past Surgical History:  Procedure Laterality Date  . CATARACT EXTRACTION, BILATERAL  2018   with implants  . CHOLECYSTECTOMY  2010   CBD cyst with enlarged duct/cyst  . COLONOSCOPY W/ POLYPECTOMY    . PANCREATIC CYST DRAINAGE  2010   due trauma  . THYROIDECTOMY, PARTIAL Right 12/2009   Past Medical History:  Diagnosis Date  . Acute on chronic respiratory failure with hypoxia (HCC)   .  Chronic atrial fibrillation   . Hepatitis A   . Muscular dystrophy (HCC)   . Pleural effusion, left   . Pneumonia of both lungs due to Pseudomonas species (HCC)    BP 103/65   Pulse 92   Temp 98.2 F (36.8 C)   Ht 6\' 3"  (1.905 m)   Wt 205 lb (93 kg)   SpO2 94%   BMI 25.62 kg/m   Opioid Risk Score:   Fall Risk Score:  `1  Depression screen PHQ 2/9  No flowsheet data found.   Review of Systems  Constitutional: Negative.   HENT: Negative.   Eyes: Negative.   Respiratory: Negative.   Cardiovascular: Negative.   Gastrointestinal: Positive for abdominal pain.  Endocrine: Negative.   Genitourinary: Negative.   Musculoskeletal: Negative.   Skin: Negative.   Allergic/Immunologic: Negative.   Neurological: Negative.   Hematological: Negative.   Psychiatric/Behavioral: Negative.   All other systems reviewed and are negative.      Objective:   Physical Exam  General: Alert and  oriented x 3, No apparent distress HEENT: Head is normocephalic, atraumatic, PERRLA, EOMI, sclera anicteric, oral mucosa pink and moist, dentition intact, ext ear canals clear,  Neck: Supple without JVD or lymphadenopathy, 652mm+ trach stoma opening. Pt hold fingers up to area but doesn't occlude stoma. Phonation and voice quality immediately improve with proper occlusion. Voice still hoarse.  Heart: Reg rate and rhythm. No murmurs rubs or gallops Chest: CTA bilaterally without wheezes, rales, or rhonchi; no distress Abdomen: Soft, non-tender, non-distended, bowel sounds positive. Extremities: No clubbing, cyanosis, or edema. Pulses are 2+ Skin: Clean and intact without signs of breakdown Neuro: Pt is cognitively appropriate with normal insight, memory, and awareness. Cranial nerves 2-12 are intact. Sensory exam is normal. Reflexes are 2+ in all 4's. Fine motor coordination is intact. No tremors. Motor function is grossly 4-5/5. Use straight cane for balance. Fair stride length. Pain impacts weight shift RLE Musculoskeletal: some antalgia with WB on RLE Psych: Pt's affect is appropriate. Pt is cooperative        Assessment & Plan:  1.Functional and mobility deficitssecondary to respiratory failure and multiple medical issues -continue with HEP     -discussed safety awareness, use of cane         2. Nutrition: intake reasonable 3.  Type 2 DM:  Per primary               4. Pulmonary: trach stoma still open, ultimately still because he's not covering the opening  -may try packing to area first with narrow gauze  -also re-educated pt on proper finger placement to occlude  -ultimately will likely need to go to trach clinic/CCM as outpt vs ENT for suturing  -pt/wife will call me back in about 1-2 weeks 5. Atrial fibrillation: amiodarone              - per cardiology/primary   Fifteen minutes of face to face patient care time were spent during this visit. All questions were  encouraged and answered.  Follow up with me PRN

## 2019-11-25 IMAGING — DX DG CHEST 1V PORT
1 series · 1 of 1 positions shown · non-contrast
Comparison: None.

CLINICAL DATA: Chronic ventilator dependent respiratory failure.

EXAM:
PORTABLE CHEST 1 VIEW

[chest ap]
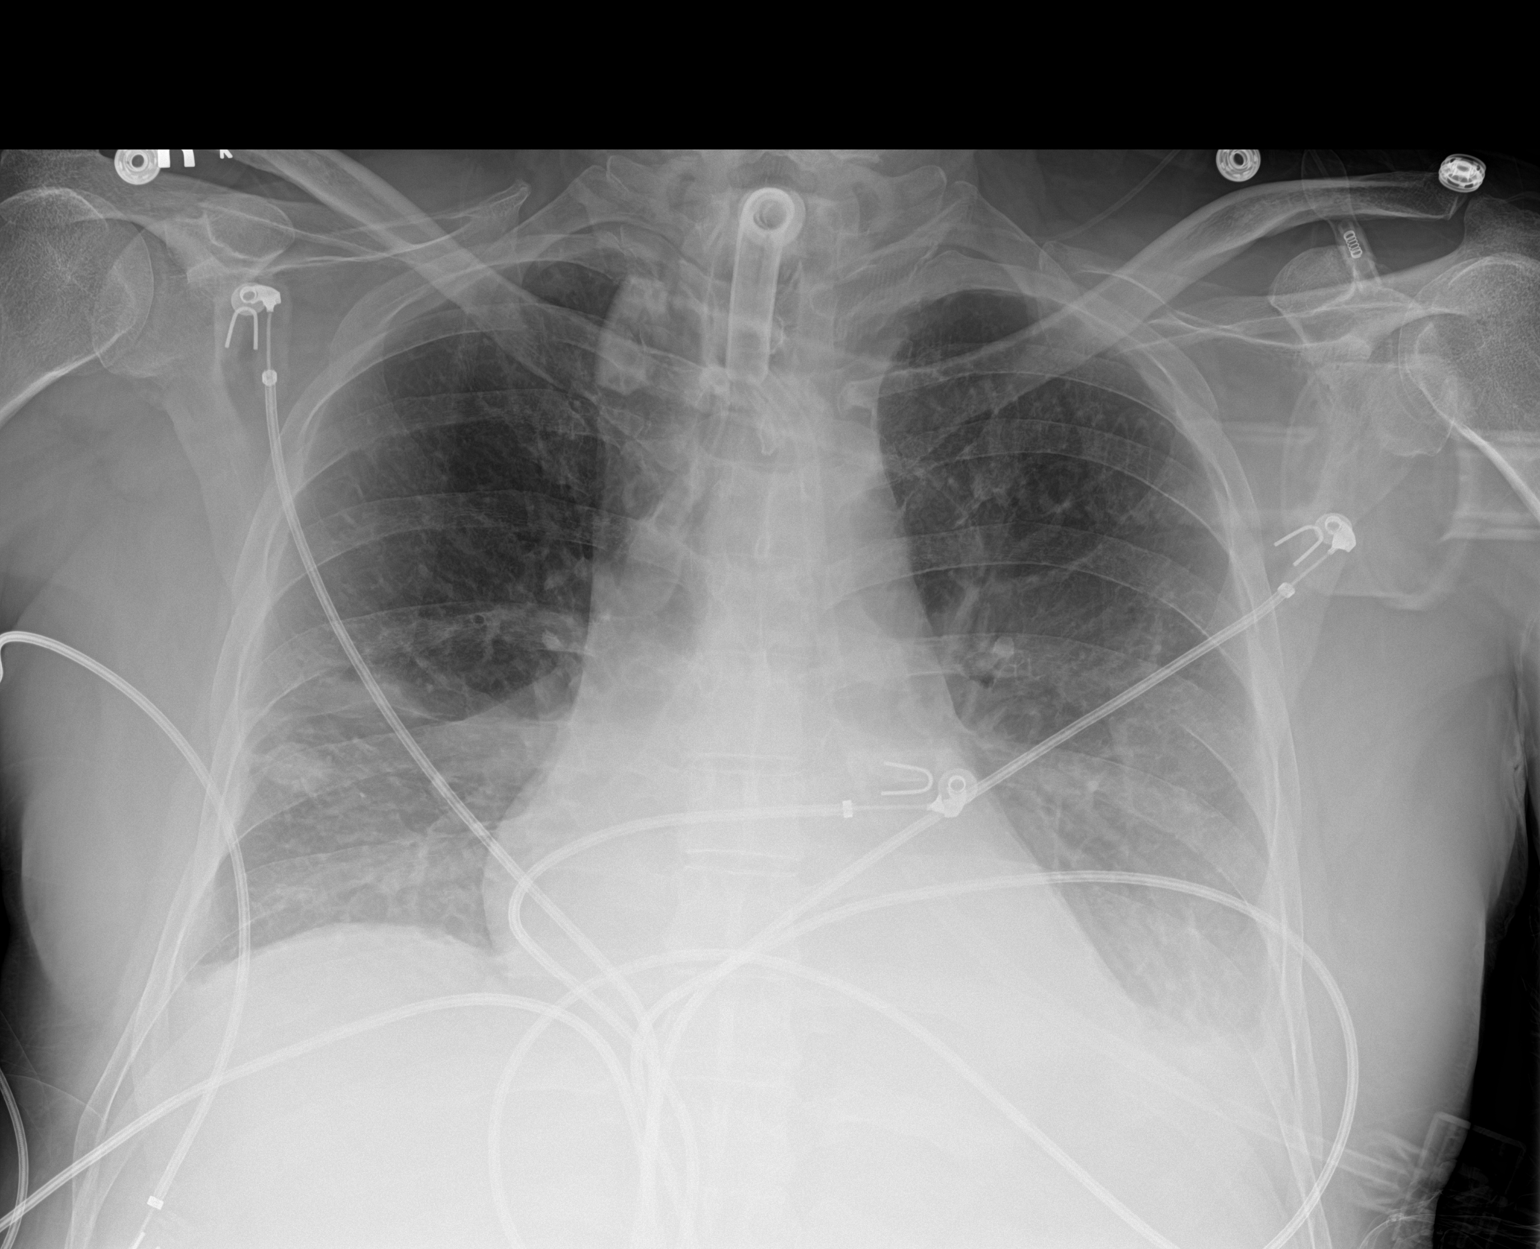

[1 of 1 positions shown; findings below may reference images not displayed]

FINDINGS: Tracheostomy tube tip in satisfactory position below the thoracic
inlet. Cardiac silhouette normal in size for AP portable technique.
Dense airspace consolidation in the LEFT LOWER LOBE. BILATERAL
pleural effusions with likely fluid in the minor fissure on the
RIGHT. Pulmonary vascularity normal without evidence of pulmonary
edema.
IMPRESSION: 1. Atelectasis and/or pneumonia involving the LEFT LOWER LOBE.
2. BILATERAL pleural effusions. No evidence of pulmonary edema

## 2019-12-22 IMAGING — DX PORTABLE CHEST - 1 VIEW
1 series · 1 of 1 positions shown · non-contrast
Comparison: 06/28/2018.

CLINICAL DATA: Respiratory failure.

EXAM:
PORTABLE CHEST 1 VIEW

[chest]
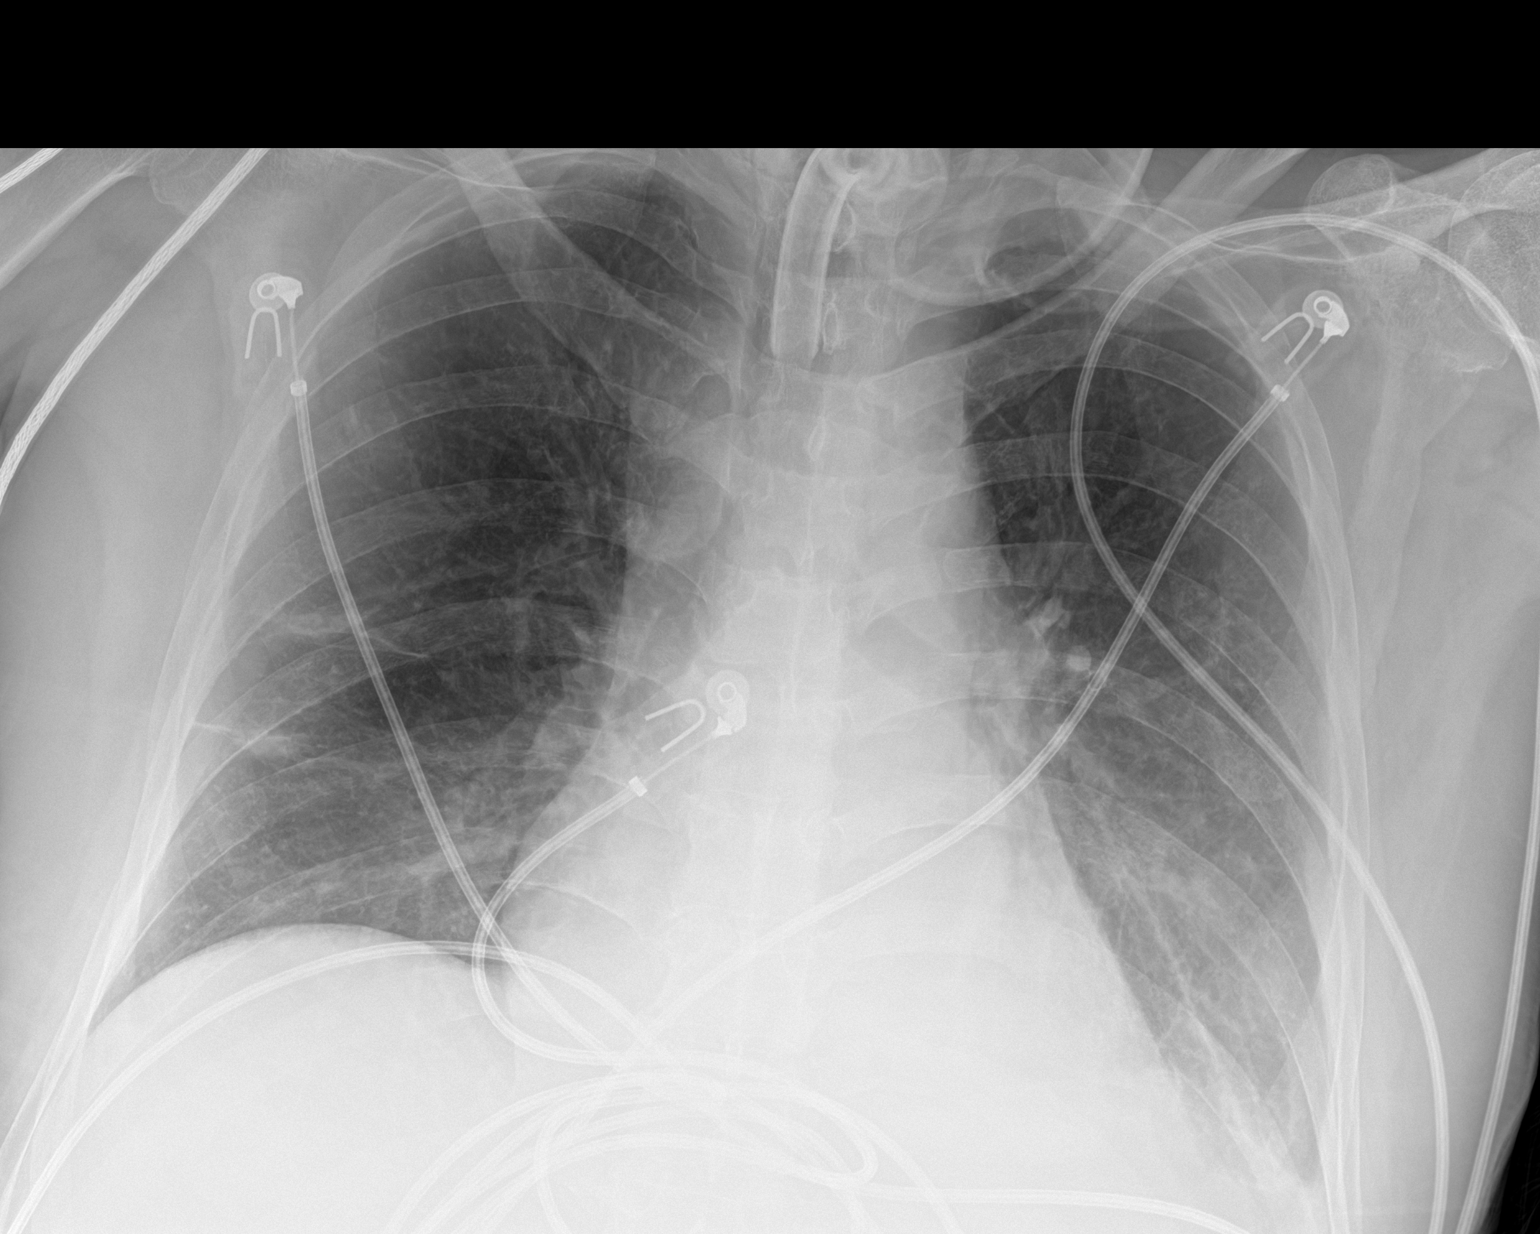

[1 of 1 positions shown; findings below may reference images not displayed]

FINDINGS: Tracheostomy tube in stable position. Stable cardiomegaly. Bibasilar
atelectasis/infiltrates again noted. Interim improvement from prior
exam. Tiny left pleural effusion again noted. Interim resolution of
right pleural effusion. No pneumothorax.
IMPRESSION: Bibasilar atelectasis/infiltrates again noted. Interim improvement
from prior exam. Tiny left pleural effusion again noted. Interim
resolution of right pleural effusion.

Stable cardiomegaly.
